# Patient Record
Sex: Female | Born: 1950 | Race: White | Hispanic: No | Smoking: Never smoker
Health system: Southern US, Community
[De-identification: ages and names within clinical notes are randomized; demographics above are authoritative.]

## PROBLEM LIST (undated history)

## (undated) DIAGNOSIS — E785 Hyperlipidemia, unspecified: Secondary | ICD-10-CM

## (undated) DIAGNOSIS — H269 Unspecified cataract: Secondary | ICD-10-CM

## (undated) DIAGNOSIS — A4902 Methicillin resistant Staphylococcus aureus infection, unspecified site: Secondary | ICD-10-CM

## (undated) DIAGNOSIS — K219 Gastro-esophageal reflux disease without esophagitis: Secondary | ICD-10-CM

## (undated) DIAGNOSIS — G709 Myoneural disorder, unspecified: Secondary | ICD-10-CM

## (undated) DIAGNOSIS — E039 Hypothyroidism, unspecified: Secondary | ICD-10-CM

## (undated) DIAGNOSIS — I10 Essential (primary) hypertension: Secondary | ICD-10-CM

## (undated) DIAGNOSIS — F419 Anxiety disorder, unspecified: Secondary | ICD-10-CM

## (undated) DIAGNOSIS — G250 Essential tremor: Secondary | ICD-10-CM

## (undated) DIAGNOSIS — H5000 Unspecified esotropia: Secondary | ICD-10-CM

## (undated) HISTORY — DX: Myoneural disorder, unspecified: G70.9

## (undated) HISTORY — PX: EYE SURGERY: SHX253

## (undated) HISTORY — DX: Unspecified cataract: H26.9

## (undated) HISTORY — DX: Hyperlipidemia, unspecified: E78.5

## (undated) HISTORY — DX: Essential (primary) hypertension: I10

## (undated) HISTORY — DX: Gastro-esophageal reflux disease without esophagitis: K21.9

## (undated) HISTORY — PX: LIPOMA EXCISION: SHX5283

## (undated) HISTORY — PX: TONSILLECTOMY: SUR1361

---

## 1999-02-11 ENCOUNTER — Other Ambulatory Visit: Admission: RE | Admit: 1999-02-11 | Discharge: 1999-02-11 | Payer: Self-pay | Admitting: Obstetrics and Gynecology

## 2000-01-28 ENCOUNTER — Other Ambulatory Visit: Admission: RE | Admit: 2000-01-28 | Discharge: 2000-01-28 | Payer: Self-pay | Admitting: Obstetrics and Gynecology

## 2001-02-11 ENCOUNTER — Other Ambulatory Visit: Admission: RE | Admit: 2001-02-11 | Discharge: 2001-02-11 | Payer: Self-pay | Admitting: Obstetrics and Gynecology

## 2002-03-31 ENCOUNTER — Other Ambulatory Visit: Admission: RE | Admit: 2002-03-31 | Discharge: 2002-03-31 | Payer: Self-pay | Admitting: Obstetrics and Gynecology

## 2003-04-03 ENCOUNTER — Other Ambulatory Visit: Admission: RE | Admit: 2003-04-03 | Discharge: 2003-04-03 | Payer: Self-pay | Admitting: Obstetrics and Gynecology

## 2004-04-15 ENCOUNTER — Other Ambulatory Visit: Admission: RE | Admit: 2004-04-15 | Discharge: 2004-04-15 | Payer: Self-pay | Admitting: Obstetrics and Gynecology

## 2004-04-24 ENCOUNTER — Encounter: Admission: RE | Admit: 2004-04-24 | Discharge: 2004-07-23 | Payer: Self-pay | Admitting: *Deleted

## 2005-04-16 ENCOUNTER — Other Ambulatory Visit: Admission: RE | Admit: 2005-04-16 | Discharge: 2005-04-16 | Payer: Self-pay | Admitting: Obstetrics and Gynecology

## 2008-11-23 DIAGNOSIS — A4902 Methicillin resistant Staphylococcus aureus infection, unspecified site: Secondary | ICD-10-CM

## 2008-11-23 HISTORY — DX: Methicillin resistant Staphylococcus aureus infection, unspecified site: A49.02

## 2009-09-05 ENCOUNTER — Encounter: Admission: RE | Admit: 2009-09-05 | Discharge: 2009-09-05 | Payer: Self-pay | Admitting: Orthopedic Surgery

## 2010-06-11 ENCOUNTER — Encounter: Admission: RE | Admit: 2010-06-11 | Discharge: 2010-06-11 | Payer: Self-pay | Admitting: Endocrinology

## 2010-12-23 ENCOUNTER — Encounter
Admission: RE | Admit: 2010-12-23 | Discharge: 2010-12-23 | Payer: Self-pay | Source: Home / Self Care | Attending: Endocrinology | Admitting: Endocrinology

## 2010-12-29 ENCOUNTER — Encounter: Payer: Self-pay | Admitting: Endocrinology

## 2011-04-02 ENCOUNTER — Other Ambulatory Visit (HOSPITAL_COMMUNITY)
Admission: RE | Admit: 2011-04-02 | Discharge: 2011-04-02 | Disposition: A | Discharge status: Home / Self Care | Payer: 59 | Source: Ambulatory Visit | Attending: Family Medicine | Admitting: Family Medicine

## 2011-04-02 ENCOUNTER — Other Ambulatory Visit: Payer: Self-pay | Admitting: Family Medicine

## 2011-04-02 DIAGNOSIS — Z124 Encounter for screening for malignant neoplasm of cervix: Secondary | ICD-10-CM | POA: Insufficient documentation

## 2011-04-06 ENCOUNTER — Other Ambulatory Visit: Payer: Self-pay | Admitting: Endocrinology

## 2011-04-06 DIAGNOSIS — E049 Nontoxic goiter, unspecified: Secondary | ICD-10-CM

## 2011-04-08 ENCOUNTER — Ambulatory Visit
Admission: RE | Admit: 2011-04-08 | Discharge: 2011-04-08 | Disposition: A | Discharge status: Home / Self Care | Payer: 59 | Source: Ambulatory Visit | Attending: Endocrinology | Admitting: Endocrinology

## 2011-04-08 DIAGNOSIS — E049 Nontoxic goiter, unspecified: Secondary | ICD-10-CM

## 2011-05-05 ENCOUNTER — Encounter: Payer: Self-pay | Admitting: Gastroenterology

## 2012-04-05 ENCOUNTER — Other Ambulatory Visit: Payer: Self-pay | Admitting: Endocrinology

## 2012-04-05 DIAGNOSIS — E049 Nontoxic goiter, unspecified: Secondary | ICD-10-CM

## 2012-04-13 DIAGNOSIS — G250 Essential tremor: Secondary | ICD-10-CM | POA: Insufficient documentation

## 2012-04-15 ENCOUNTER — Ambulatory Visit
Admission: RE | Admit: 2012-04-15 | Discharge: 2012-04-15 | Disposition: A | Discharge status: Home / Self Care | Payer: BC Managed Care – PPO | Source: Ambulatory Visit | Attending: Endocrinology | Admitting: Endocrinology

## 2012-04-15 DIAGNOSIS — E049 Nontoxic goiter, unspecified: Secondary | ICD-10-CM

## 2012-10-14 ENCOUNTER — Other Ambulatory Visit: Payer: Self-pay | Admitting: Obstetrics and Gynecology

## 2013-03-28 ENCOUNTER — Other Ambulatory Visit: Payer: Self-pay | Admitting: Endocrinology

## 2013-03-28 DIAGNOSIS — E049 Nontoxic goiter, unspecified: Secondary | ICD-10-CM

## 2013-05-02 DIAGNOSIS — H31109 Choroidal degeneration, unspecified, unspecified eye: Secondary | ICD-10-CM | POA: Insufficient documentation

## 2013-06-26 ENCOUNTER — Other Ambulatory Visit: Payer: BC Managed Care – PPO

## 2013-06-29 ENCOUNTER — Ambulatory Visit
Admission: RE | Admit: 2013-06-29 | Discharge: 2013-06-29 | Disposition: A | Discharge status: Home / Self Care | Payer: BC Managed Care – PPO | Source: Ambulatory Visit | Attending: Endocrinology | Admitting: Endocrinology

## 2013-06-29 DIAGNOSIS — E049 Nontoxic goiter, unspecified: Secondary | ICD-10-CM

## 2013-12-15 ENCOUNTER — Other Ambulatory Visit: Payer: Self-pay | Admitting: Obstetrics and Gynecology

## 2014-03-30 ENCOUNTER — Other Ambulatory Visit: Payer: Self-pay | Admitting: Endocrinology

## 2014-03-30 DIAGNOSIS — E049 Nontoxic goiter, unspecified: Secondary | ICD-10-CM

## 2014-07-14 DIAGNOSIS — H35051 Retinal neovascularization, unspecified, right eye: Secondary | ICD-10-CM | POA: Insufficient documentation

## 2014-12-26 DIAGNOSIS — H2513 Age-related nuclear cataract, bilateral: Secondary | ICD-10-CM | POA: Insufficient documentation

## 2015-01-24 ENCOUNTER — Other Ambulatory Visit: Payer: Self-pay | Admitting: Obstetrics and Gynecology

## 2015-01-25 LAB — CYTOLOGY - PAP

## 2015-03-20 ENCOUNTER — Ambulatory Visit
Admission: RE | Admit: 2015-03-20 | Discharge: 2015-03-20 | Disposition: A | Discharge status: Home / Self Care | Payer: BLUE CROSS/BLUE SHIELD | Source: Ambulatory Visit | Attending: Endocrinology | Admitting: Endocrinology

## 2015-03-20 DIAGNOSIS — E049 Nontoxic goiter, unspecified: Secondary | ICD-10-CM

## 2015-04-24 ENCOUNTER — Other Ambulatory Visit: Payer: Self-pay | Admitting: Dermatology

## 2015-04-24 DIAGNOSIS — C4491 Basal cell carcinoma of skin, unspecified: Secondary | ICD-10-CM

## 2015-04-24 HISTORY — DX: Basal cell carcinoma of skin, unspecified: C44.91

## 2016-02-17 ENCOUNTER — Other Ambulatory Visit: Payer: Self-pay | Admitting: Obstetrics and Gynecology

## 2016-02-17 LAB — HM PAP SMEAR

## 2016-02-19 LAB — CYTOLOGY - PAP

## 2016-02-24 ENCOUNTER — Other Ambulatory Visit: Payer: Self-pay | Admitting: Obstetrics and Gynecology

## 2016-02-24 DIAGNOSIS — N951 Menopausal and female climacteric states: Secondary | ICD-10-CM

## 2016-04-16 ENCOUNTER — Other Ambulatory Visit: Payer: BLUE CROSS/BLUE SHIELD

## 2016-04-27 ENCOUNTER — Other Ambulatory Visit: Payer: BLUE CROSS/BLUE SHIELD

## 2016-06-02 ENCOUNTER — Ambulatory Visit
Admission: RE | Admit: 2016-06-02 | Discharge: 2016-06-02 | Disposition: A | Discharge status: Home / Self Care | Payer: BLUE CROSS/BLUE SHIELD | Source: Ambulatory Visit | Attending: Obstetrics and Gynecology | Admitting: Obstetrics and Gynecology

## 2016-06-02 DIAGNOSIS — N951 Menopausal and female climacteric states: Secondary | ICD-10-CM

## 2017-02-03 ENCOUNTER — Other Ambulatory Visit: Payer: Self-pay | Admitting: Endocrinology

## 2017-02-03 DIAGNOSIS — E049 Nontoxic goiter, unspecified: Secondary | ICD-10-CM

## 2017-03-25 ENCOUNTER — Ambulatory Visit
Admission: RE | Admit: 2017-03-25 | Discharge: 2017-03-25 | Disposition: A | Discharge status: Home / Self Care | Payer: BLUE CROSS/BLUE SHIELD | Source: Ambulatory Visit | Attending: Endocrinology | Admitting: Endocrinology

## 2017-03-25 ENCOUNTER — Other Ambulatory Visit: Payer: BLUE CROSS/BLUE SHIELD

## 2017-03-25 DIAGNOSIS — E049 Nontoxic goiter, unspecified: Secondary | ICD-10-CM

## 2017-04-01 IMAGING — US US SOFT TISSUE HEAD/NECK
1 series · 13 of 25 positions shown · non-contrast
Comparison: 06/29/2013; 04/15/2012 ; 04/08/2011

CLINICAL DATA: Follow-up thyroid goiter.

EXAM:
THYROID ULTRASOUND
TECHNIQUE: Ultrasound examination of the thyroid gland and adjacent soft
tissues was performed.

[Series 1: us soft tissue head/neck · 0.09mm/px · 13 of 43 slices shown]
[im 1/43]
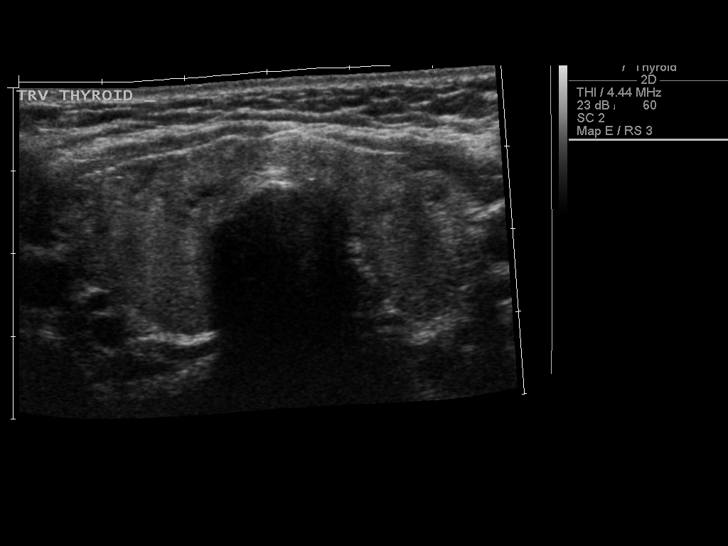
[im 4/43]
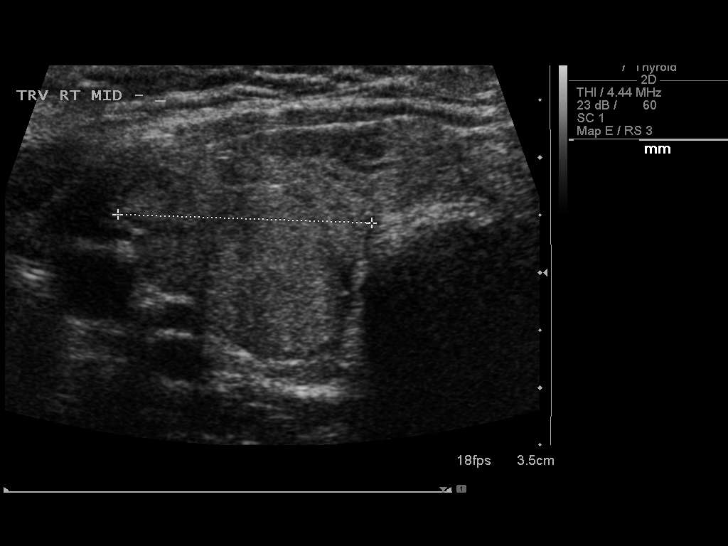
[im 8/43]
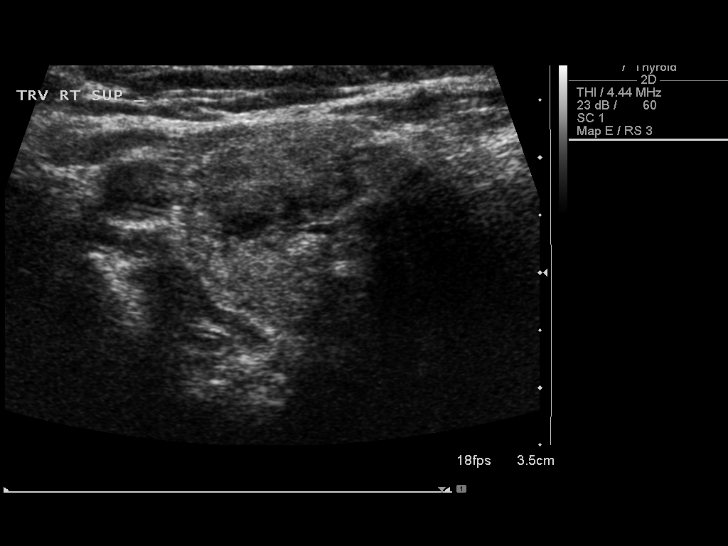
[im 11/43]
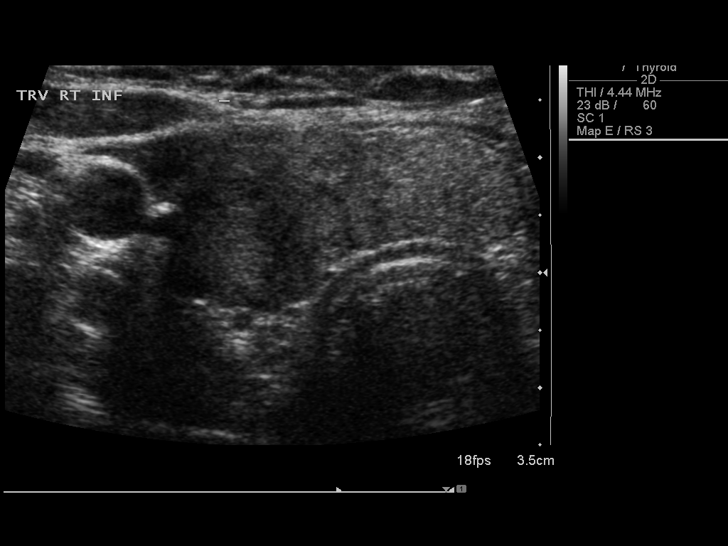
[im 15/43]
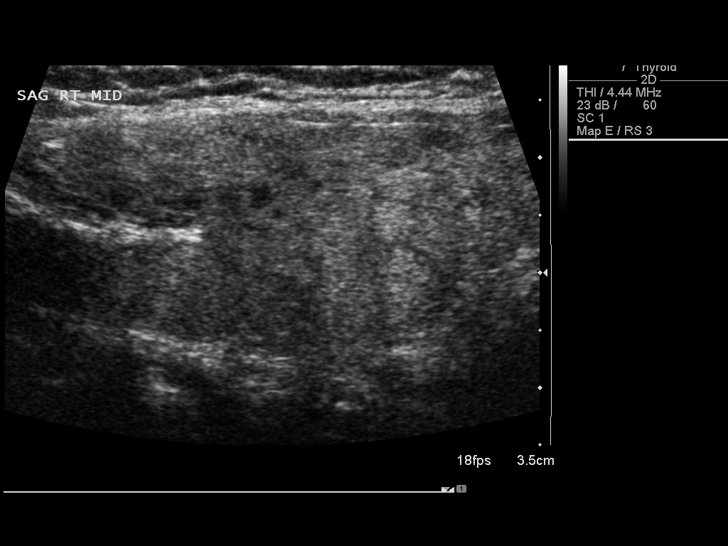
[im 18/43]
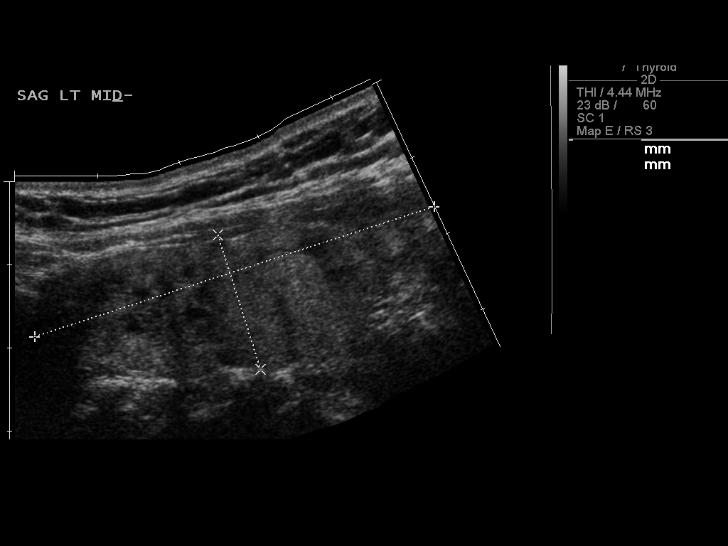
[im 22/43]
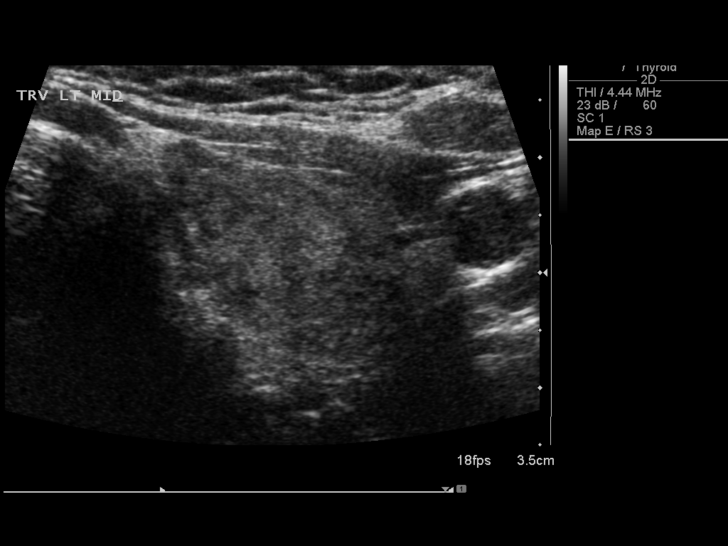
[im 25/43]
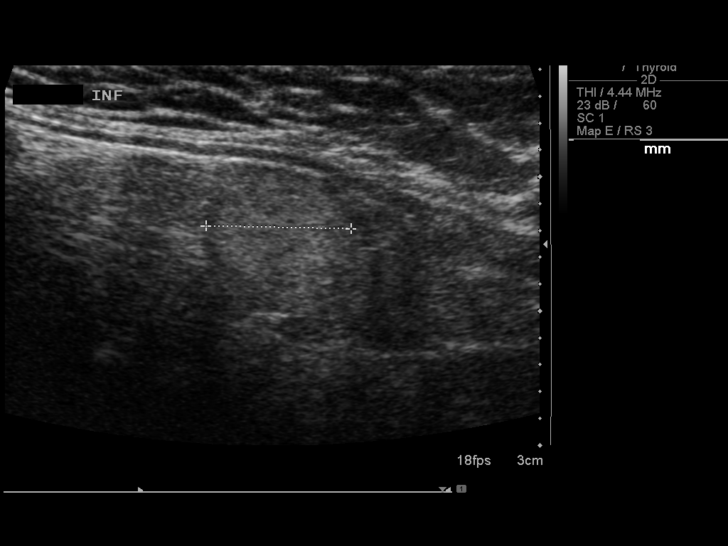
[im 29/43]
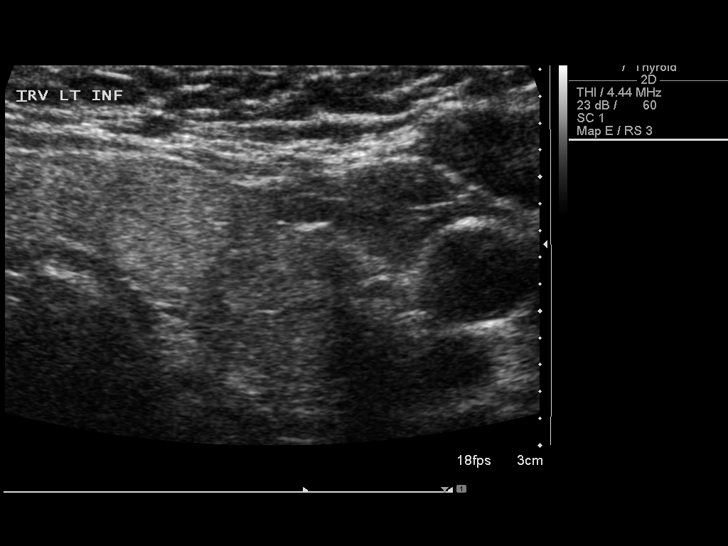
[im 32/43]
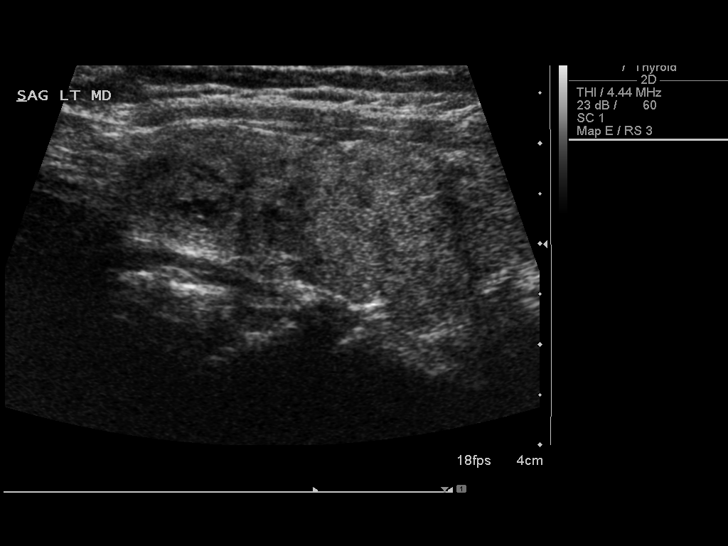
[im 36/43]
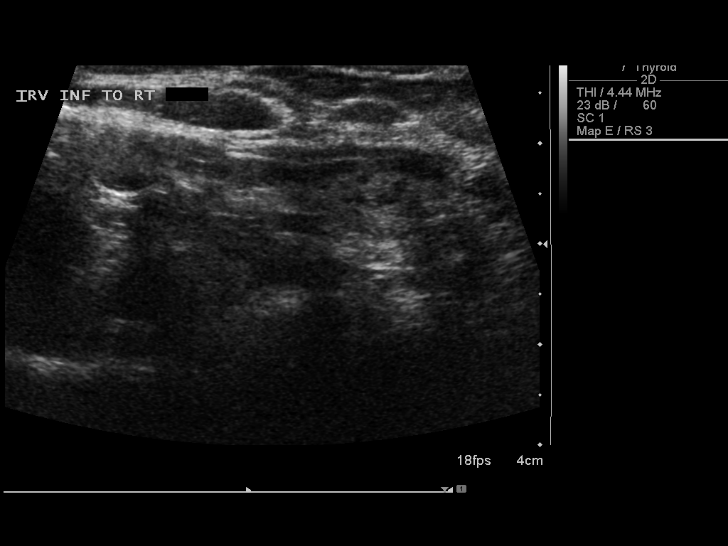
[im 39/43]
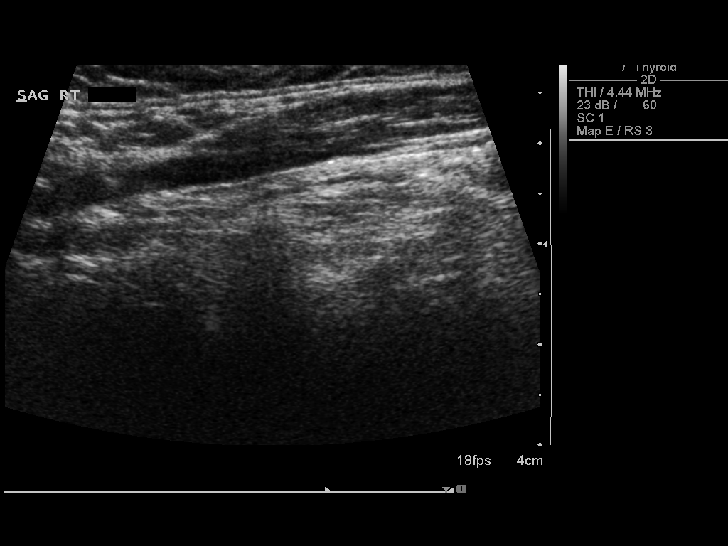
[im 43/43]
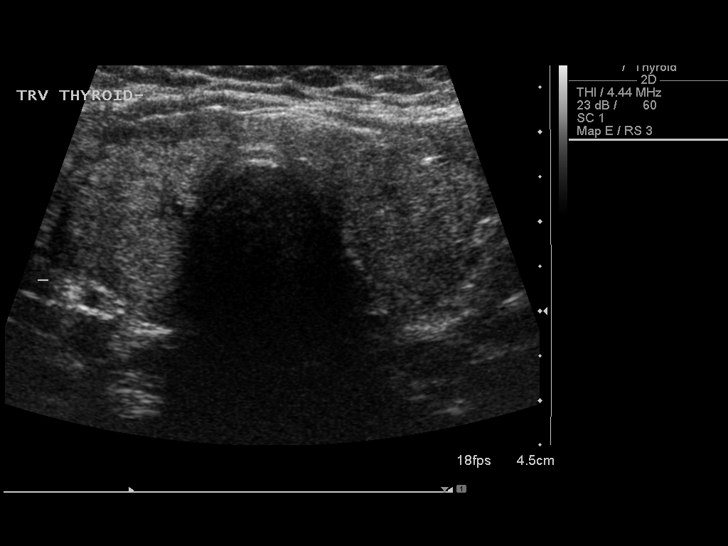

[13 of 25 positions shown; findings below may reference images not displayed]

FINDINGS: There is unchanged diffuse heterogeneity of thyroid parenchymal
echotexture. No new or enlarging thyroid nodules.

Right thyroid lobe

Measurements: Borderline enlarged measuring 5.2 x 2.0 x 2.2 cm,
unchanged, previously, 6.2 x 1.9 x 2.8 cm.

There is unchanged marked diffuse heterogeneity of the right lobe of
the thyroid without discrete nodule or mass.

Left thyroid lobe

Measurements: Borderline enlarged measuring 5.0 x 1.7 x 1.9 cm,
unchanged, previously, 5.2 x 1.7 x 1.9 cm.

Left, inferior, medial - 1.0 x 0.9 x 1.1 cm - mixed echogenic,
solid, ill-defined, potentially a pseudo nodule - grossly unchanged
since the [DATE] examination, previously, 1.2 x 1.1 x 1.1 cm.

Isthmus

Thickness: Borderline enlarged measuring in diameter, unchanged..

No discrete nodule or mass identified within the thyroid isthmus.

Lymphadenopathy

None visualized.
IMPRESSION: Similar findings of multi nodular goiter. No new or enlarging
thyroid nodules with the dominant left-sided thyroid nodule
appearing grossly unchanged since the [DATE] examination.

## 2017-10-19 ENCOUNTER — Ambulatory Visit: Payer: Self-pay | Admitting: Ophthalmology

## 2017-10-19 DIAGNOSIS — H5005 Alternating esotropia: Secondary | ICD-10-CM | POA: Insufficient documentation

## 2017-10-20 ENCOUNTER — Encounter (HOSPITAL_BASED_OUTPATIENT_CLINIC_OR_DEPARTMENT_OTHER): Payer: Self-pay | Admitting: *Deleted

## 2017-10-20 ENCOUNTER — Other Ambulatory Visit: Payer: Self-pay

## 2017-10-21 ENCOUNTER — Ambulatory Visit: Payer: Self-pay | Admitting: Ophthalmology

## 2017-10-21 NOTE — H&P (Signed)
Date of examination:  09-22-17  Indication for surgery: to straighten the eyes and allow some binocularity  Pertinent past medical history:  Past Medical History:  Diagnosis Date  . Anxiety   . Benign essential tremor    on inderal  . Esotropia   . Hypothyroidism     Pertinent ocular history:  Diplopia x 3-4 years.  Hx monovision.  Pseudophakic.    Pertinent family history: No family history on file.  General:  Healthy appearing patient in no distress.    Eyes:    Acuity Emlyn  OD 20/25  OS 20/20  External: Within normal limits     Anterior segment: PC IOL OU  Motility:   E(T)=10, comitant, fl RHT.  E(T)'=8.  Fuses with 10 BO, not 6 or 8  Fundus: deferred  Refraction: plano OU approx  Heart: Regular rate and rhythm without murmur     Lungs: Clear to auscultation      Impression:Intermittent esotropia, small angle, small fusional amplitudes.  Note Hx monovision-->disrupted fusion?  Plan: Because 10 PD is too small for a recession and too large for partial tenotomy of one medial rectus muscle, will do partial tenotomy of both MR's: Half to 2/3 of the tendon of one, then as much of the other as needed for the remainder, awake in OR.  Could do Scott's graded partial tendon recession of one MR to get about 10 PD, but with her very small fusional amplitudes I feel we need the precision of the awake partial tenotomy  Derry Skill

## 2017-10-21 NOTE — H&P (View-Only) (Signed)
Date of examination:  09-22-17  Indication for surgery: to straighten the eyes and allow some binocularity  Pertinent past medical history:  Past Medical History:  Diagnosis Date  . Anxiety   . Benign essential tremor    on inderal  . Esotropia   . Hypothyroidism     Pertinent ocular history:  Diplopia x 3-4 years.  Hx monovision.  Pseudophakic.    Pertinent family history: No family history on file.  General:  Healthy appearing patient in no distress.    Eyes:    Acuity Warren  OD 20/25  OS 20/20  External: Within normal limits     Anterior segment: PC IOL OU  Motility:   E(T)=10, comitant, fl RHT.  E(T)'=8.  Fuses with 10 BO, not 6 or 8  Fundus: deferred  Refraction: plano OU approx  Heart: Regular rate and rhythm without murmur     Lungs: Clear to auscultation      Impression:Intermittent esotropia, small angle, small fusional amplitudes.  Note Hx monovision-->disrupted fusion?  Plan: Because 10 PD is too small for a recession and too large for partial tenotomy of one medial rectus muscle, will do partial tenotomy of both MR's: Half to 2/3 of the tendon of one, then as much of the other as needed for the remainder, awake in OR.  Could do Scott's graded partial tendon recession of one MR to get about 10 PD, but with her very small fusional amplitudes I feel we need the precision of the awake partial tenotomy  Derry Skill

## 2017-10-22 ENCOUNTER — Other Ambulatory Visit: Payer: Self-pay

## 2017-10-22 ENCOUNTER — Encounter (HOSPITAL_BASED_OUTPATIENT_CLINIC_OR_DEPARTMENT_OTHER): Payer: Self-pay | Admitting: *Deleted

## 2017-10-22 ENCOUNTER — Ambulatory Visit (HOSPITAL_BASED_OUTPATIENT_CLINIC_OR_DEPARTMENT_OTHER): Payer: BLUE CROSS/BLUE SHIELD | Admitting: Anesthesiology

## 2017-10-22 ENCOUNTER — Ambulatory Visit (HOSPITAL_BASED_OUTPATIENT_CLINIC_OR_DEPARTMENT_OTHER)
Admission: RE | Admit: 2017-10-22 | Discharge: 2017-10-22 | Disposition: A | Discharge status: Home / Self Care | Payer: BLUE CROSS/BLUE SHIELD | Source: Ambulatory Visit | Attending: Ophthalmology | Admitting: Ophthalmology

## 2017-10-22 ENCOUNTER — Encounter (HOSPITAL_BASED_OUTPATIENT_CLINIC_OR_DEPARTMENT_OTHER)
Admission: RE | Disposition: A | Discharge status: Home / Self Care | Payer: Self-pay | Source: Ambulatory Visit | Attending: Ophthalmology

## 2017-10-22 DIAGNOSIS — F419 Anxiety disorder, unspecified: Secondary | ICD-10-CM | POA: Insufficient documentation

## 2017-10-22 DIAGNOSIS — E039 Hypothyroidism, unspecified: Secondary | ICD-10-CM | POA: Insufficient documentation

## 2017-10-22 DIAGNOSIS — H5000 Unspecified esotropia: Secondary | ICD-10-CM | POA: Diagnosis not present

## 2017-10-22 DIAGNOSIS — G250 Essential tremor: Secondary | ICD-10-CM | POA: Diagnosis not present

## 2017-10-22 HISTORY — PX: STRABISMUS SURGERY: SHX218

## 2017-10-22 HISTORY — DX: Anxiety disorder, unspecified: F41.9

## 2017-10-22 HISTORY — DX: Unspecified esotropia: H50.00

## 2017-10-22 HISTORY — DX: Hypothyroidism, unspecified: E03.9

## 2017-10-22 HISTORY — DX: Essential tremor: G25.0

## 2017-10-22 HISTORY — DX: Methicillin resistant Staphylococcus aureus infection, unspecified site: A49.02

## 2017-10-22 SURGERY — STRABISMUS SURGERY, BILATERAL
Anesthesia: Monitor Anesthesia Care | Site: Eye | Laterality: Bilateral

## 2017-10-22 MED ORDER — DEXAMETHASONE SODIUM PHOSPHATE 10 MG/ML IJ SOLN
INTRAMUSCULAR | Status: DC | PRN
  Administered 2017-10-22: 5 mg via INTRAVENOUS

## 2017-10-22 MED ORDER — SCOPOLAMINE 1 MG/3DAYS TD PT72: 1.0000 | MEDICATED_PATCH | Freq: Once | TRANSDERMAL | Status: DC | PRN

## 2017-10-22 MED ORDER — FENTANYL CITRATE (PF) 100 MCG/2ML IJ SOLN
INTRAMUSCULAR | Status: AC
  Filled 2017-10-22: qty 2

## 2017-10-22 MED ORDER — MEPERIDINE HCL 25 MG/ML IJ SOLN: 6.2500 mg | INTRAMUSCULAR | Status: DC | PRN

## 2017-10-22 MED ORDER — KETOROLAC TROMETHAMINE 30 MG/ML IJ SOLN
INTRAMUSCULAR | Status: DC | PRN
  Administered 2017-10-22: 30 mg via INTRAVENOUS

## 2017-10-22 MED ORDER — GLYCOPYRROLATE 0.2 MG/ML IJ SOLN
INTRAMUSCULAR | Status: DC | PRN
  Administered 2017-10-22: 0.2 mg via INTRAVENOUS

## 2017-10-22 MED ORDER — BSS IO SOLN
INTRAOCULAR | Status: DC | PRN
  Administered 2017-10-22: 15 mL via INTRAOCULAR

## 2017-10-22 MED ORDER — DEXAMETHASONE SODIUM PHOSPHATE 10 MG/ML IJ SOLN
INTRAMUSCULAR | Status: AC
  Filled 2017-10-22: qty 1

## 2017-10-22 MED ORDER — LACTATED RINGERS IV SOLN
INTRAVENOUS | Status: DC
  Administered 2017-10-22: 08:00:00 via INTRAVENOUS

## 2017-10-22 MED ORDER — KETOROLAC TROMETHAMINE 30 MG/ML IJ SOLN
INTRAMUSCULAR | Status: AC
  Filled 2017-10-22: qty 1

## 2017-10-22 MED ORDER — MIDAZOLAM HCL 2 MG/2ML IJ SOLN
1.0000 mg | INTRAMUSCULAR | Status: DC | PRN
  Administered 2017-10-22: 2 mg via INTRAVENOUS

## 2017-10-22 MED ORDER — PROMETHAZINE HCL 25 MG/ML IJ SOLN: 6.2500 mg | INTRAMUSCULAR | Status: DC | PRN

## 2017-10-22 MED ORDER — MIDAZOLAM HCL 2 MG/2ML IJ SOLN
INTRAMUSCULAR | Status: AC
  Filled 2017-10-22: qty 2

## 2017-10-22 MED ORDER — TETRACAINE HCL 0.5 % OP SOLN
OPHTHALMIC | Status: AC
  Filled 2017-10-22: qty 8

## 2017-10-22 MED ORDER — ONDANSETRON HCL 4 MG/2ML IJ SOLN
INTRAMUSCULAR | Status: DC | PRN
Start: 2017-10-22 — End: 2017-10-22
  Administered 2017-10-22: 4 mg via INTRAVENOUS

## 2017-10-22 MED ORDER — TOBRAMYCIN-DEXAMETHASONE 0.3-0.1 % OP OINT
TOPICAL_OINTMENT | OPHTHALMIC | Status: DC | PRN
  Administered 2017-10-22: 1 via OPHTHALMIC

## 2017-10-22 MED ORDER — FENTANYL CITRATE (PF) 100 MCG/2ML IJ SOLN
50.0000 ug | INTRAMUSCULAR | Status: AC | PRN
  Administered 2017-10-22: 50 ug via INTRAVENOUS
  Administered 2017-10-22 (×2): 25 ug via INTRAVENOUS

## 2017-10-22 MED ORDER — HYDROMORPHONE HCL 1 MG/ML IJ SOLN: 0.2500 mg | INTRAMUSCULAR | Status: DC | PRN

## 2017-10-22 MED ORDER — ONDANSETRON HCL 4 MG/2ML IJ SOLN
INTRAMUSCULAR | Status: AC
  Filled 2017-10-22: qty 2

## 2017-10-22 MED ORDER — LACTATED RINGERS IV SOLN: INTRAVENOUS | Status: DC

## 2017-10-22 MED ORDER — TETRACAINE 0.5 % OP SOLN OPTIME - NO CHARGE
OPHTHALMIC | Status: DC | PRN
  Administered 2017-10-22: 2 [drp] via OPHTHALMIC

## 2017-10-22 SURGICAL SUPPLY — 30 items
APL SRG 3 HI ABS STRL LF PLS (MISCELLANEOUS) ×1
APPLICATOR COTTON TIP 6IN STRL (MISCELLANEOUS) ×8 IMPLANT
APPLICATOR DR MATTHEWS STRL (MISCELLANEOUS) ×2 IMPLANT
BANDAGE EYE OVAL (MISCELLANEOUS) IMPLANT
CAUTERY EYE LOW TEMP 1300F FIN (OPHTHALMIC RELATED) ×1 IMPLANT
COVER BACK TABLE 60X90IN (DRAPES) ×2 IMPLANT
COVER MAYO STAND STRL (DRAPES) ×2 IMPLANT
DRAPE SURG 17X23 STRL (DRAPES) ×4 IMPLANT
DRAPE U-SHAPE 76X120 STRL (DRAPES) ×1 IMPLANT
GLOVE BIO SURGEON STRL SZ 6.5 (GLOVE) ×5 IMPLANT
GLOVE BIOGEL M STRL SZ7.5 (GLOVE) ×2 IMPLANT
GOWN STRL REUS W/ TWL LRG LVL3 (GOWN DISPOSABLE) ×1 IMPLANT
GOWN STRL REUS W/TWL LRG LVL3 (GOWN DISPOSABLE) ×2
GOWN STRL REUS W/TWL XL LVL3 (GOWN DISPOSABLE) ×4 IMPLANT
NS IRRIG 1000ML POUR BTL (IV SOLUTION) ×2 IMPLANT
PACK BASIN DAY SURGERY FS (CUSTOM PROCEDURE TRAY) ×2 IMPLANT
SHEET MEDIUM DRAPE 40X70 STRL (DRAPES) IMPLANT
SPEAR EYE SURG WECK-CEL (MISCELLANEOUS) ×8 IMPLANT
STRIP CLOSURE SKIN 1/4X4 (GAUZE/BANDAGES/DRESSINGS) IMPLANT
SUT 6 0 SILK T G140 8DA (SUTURE) IMPLANT
SUT MERSILENE 6-0 18IN S14 8MM (SUTURE)
SUT PLAIN 6 0 TG1408 (SUTURE) ×1 IMPLANT
SUT SILK 4 0 C 3 735G (SUTURE) IMPLANT
SUT VICRYL 6 0 S 28 (SUTURE) IMPLANT
SUT VICRYL ABS 6-0 S29 18IN (SUTURE) ×1 IMPLANT
SUTURE MERSLN 6-0 18IN S14 8MM (SUTURE) IMPLANT
SYR 10ML LL (SYRINGE) ×2 IMPLANT
SYR TB 1ML LL NO SAFETY (SYRINGE) ×2 IMPLANT
TOWEL OR 17X24 6PK STRL BLUE (TOWEL DISPOSABLE) ×2 IMPLANT
TRAY DSU PREP LF (CUSTOM PROCEDURE TRAY) ×2 IMPLANT

## 2017-10-22 NOTE — Anesthesia Preprocedure Evaluation (Addendum)
Anesthesia Evaluation  Patient identified by MRN, date of birth, ID band Patient awake    Reviewed: Allergy & Precautions, NPO status , Patient's Chart, lab work & pertinent test results  Airway Mallampati: I       Dental  (+) Teeth Intact, Dental Advisory Given   Pulmonary neg pulmonary ROS,    breath sounds clear to auscultation       Cardiovascular negative cardio ROS   Rhythm:Regular Rate:Normal     Neuro/Psych Anxiety negative neurological ROS     GI/Hepatic negative GI ROS, Neg liver ROS,   Endo/Other  Hypothyroidism   Renal/GU negative Renal ROS     Musculoskeletal negative musculoskeletal ROS (+)   Abdominal Normal abdominal exam  (+)   Peds  Hematology negative hematology ROS (+)   Anesthesia Other Findings Day of surgery medications reviewed with the patient.  Reproductive/Obstetrics                            Anesthesia Physical Anesthesia Plan  ASA: II  Anesthesia Plan: MAC   Post-op Pain Management:    Induction: Intravenous  PONV Risk Score and Plan: 3 and Ondansetron, Propofol infusion and Midazolam  Airway Management Planned: Natural Airway and Nasal Cannula  Additional Equipment:   Intra-op Plan:   Post-operative Plan:   Informed Consent: I have reviewed the patients History and Physical, chart, labs and discussed the procedure including the risks, benefits and alternatives for the proposed anesthesia with the patient or authorized representative who has indicated his/her understanding and acceptance.     Plan Discussed with:   Anesthesia Plan Comments:         Anesthesia Quick Evaluation

## 2017-10-22 NOTE — Transfer of Care (Signed)
Immediate Anesthesia Transfer of Care Note  Patient: Gwendolyn Sampson  Procedure(s) Performed: REPAIR STRABISMUS BILATERAL (Bilateral Eye)  Patient Location: PACU  Anesthesia Type:MAC  Level of Consciousness: awake, alert  and oriented  Airway & Oxygen Therapy: Patient Spontanous Breathing  Post-op Assessment: Report given to RN and Post -op Vital signs reviewed and stable  Post vital signs: Reviewed and stable  Last Vitals:  Vitals:   10/22/17 0730  BP: (!) 141/92  Pulse: 60  Resp: 18  Temp: 36.9 C  SpO2: 100%    Last Pain:  Vitals:   10/22/17 0730  TempSrc: Oral      Patients Stated Pain Goal: 0 (16/10/96 0454)  Complications: No apparent anesthesia complications

## 2017-10-22 NOTE — Discharge Instructions (Signed)
°  Post Anesthesia Home Care Instructions ° °Activity: °Get plenty of rest for the remainder of the day. A responsible individual must stay with you for 24 hours following the procedure.  °For the next 24 hours, DO NOT: °-Drive a car °-Operate machinery °-Drink alcoholic beverages °-Take any medication unless instructed by your physician °-Make any legal decisions or sign important papers. ° °Meals: °Start with liquid foods such as gelatin or soup. Progress to regular foods as tolerated. Avoid greasy, spicy, heavy foods. If nausea and/or vomiting occur, drink only clear liquids until the nausea and/or vomiting subsides. Call your physician if vomiting continues. ° °Special Instructions/Symptoms: °Your throat may feel dry or sore from the anesthesia or the breathing tube placed in your throat during surgery. If this causes discomfort, gargle with warm salt water. The discomfort should disappear within 24 hours. ° °If you had a scopolamine patch placed behind your ear for the management of post- operative nausea and/or vomiting: ° °1. The medication in the patch is effective for 72 hours, after which it should be removed.  Wrap patch in a tissue and discard in the trash. Wash hands thoroughly with soap and water. °2. You may remove the patch earlier than 72 hours if you experience unpleasant side effects which may include dry mouth, dizziness or visual disturbances. °3. Avoid touching the patch. Wash your hands with soap and water after contact with the patch. ° ° °Dr. Mumtaz Lovins's Postop Instructions:   ° °Diet: Clear liquids, advance to soft foods then regular diet as tolerated by this evening. ° °Pain control:  ° 1)  Ibuprofen 600 mg by mouth every 6-8 hours as needed for pain ° 2)  Ice pack/cold compress to operated eye(s) as desired ° °Eye medications:   Tobradex or Zylet eye ointment 1/2 inch in operated eye(s) twice a day if directed to do so by Dr. Tauheedah Bok ° °Activity: No swimming for 1 week.  It is OK to let water  run over the face and eyes while showering or taking a bath, even during the first week.  No other restriction on exercise or activity. ° °Call Dr. Iden Stripling's office [phone] with any problems or concerns. °  ° °

## 2017-10-22 NOTE — Op Note (Signed)
10/22/2017  11:40 AM  PATIENT:  Gwendolyn Sampson    PRE-OPERATIVE DIAGNOSIS:  ESOTROPIA  POST-OPERATIVE DIAGNOSIS:  same  PROCEDURE:  1.  Partial tendon recession 3.5 mm left eye   2.  Graded partial tenotomy 85% right eye  SURGEON:  Derry Skill, MD  ANESTHESIA:   General  COMPLICATIONS: none  OPERATIVE PROCEDURE: After routine preoperative timeout patient including informed consent, the patient was taken to the operating room where she was identified by me. Monitors were placed by anesthesia. Light IV sedation was administered.  Light IV sedation was administered.  The patient was prepped and draped in sterile fashion.  Topical tetracaine was placed in the left eye.  Lid speculum was placed in the left eye. Through an inferonasal fornix incision through conjunctiva contents fashion, the left medial rectus muscle was engaged and a series of muscle hooks. The inferior end of this insertion was secured with a 6-0 Vicryl suture, tied securely approximately 1 mm from the insertion. The muscle was disinserted beginning at the inferior end of the insertion, leaving about 1.5 mm of the muscle leaving about 1.5 mm of the muscle insertion intact.  The disinserted corner of the muscle The disinserted corner of the muscle was reattached to sclera 3 mm posterior to the inferior and the muscle stump, effecting a 3.5 mm graded partial tenotomy of the left medial rectus muscle.  The lid speculum was transferred to the right eye, where the medial rectus muscle was isolated as described left eye. The right medial rectus muscle was disinserted beginning at the superior end of the insertion, continuing to about the midpoint of the insertion. At this point the patient was sat up. She was still diplopic when viewing a vertical line about 10 feet from her.  She was again placed supine She was again placed supine and another another 1-2 mm of the muscle was disinserted.  She still had intermittent diplopia when sat  upright, so the disinsertion was continued until about 1.5 mm of the insertion remained intact.  At this point she had stable single vision . Conjunctiva was closed with a single 6-0 plain gut suture. Tobrex ointment was placed in each eye. The patient was taken to recovery room in stable condition, having suffered no intraoperative or immediate postoperative complications.  Derry Skill, MD

## 2017-10-22 NOTE — Interval H&P Note (Signed)
History and Physical Interval Note:  10/22/2017 9:30 AM  Gwendolyn Sampson  has presented today for surgery, with the diagnosis of ESOTROPIA  The various methods of treatment have been discussed with the patient and family. After consideration of risks, benefits and other options for treatment, the patient has consented to  Procedure(s): REPAIR STRABISMUS BILATERAL (Bilateral) as a surgical intervention .  The patient's history has been reviewed, patient examined, no change in status, stable for surgery.  I have reviewed the patient's chart and labs.  Questions were answered to the patient's satisfaction.     Derry Skill

## 2017-10-22 NOTE — Anesthesia Postprocedure Evaluation (Signed)
Anesthesia Post Note  Patient: Gwendolyn Sampson  Procedure(s) Performed: REPAIR STRABISMUS BILATERAL (Bilateral Eye)     Patient location during evaluation: PACU Anesthesia Type: MAC Level of consciousness: awake and alert Pain management: pain level controlled Vital Signs Assessment: post-procedure vital signs reviewed and stable Respiratory status: spontaneous breathing, nonlabored ventilation, respiratory function stable and patient connected to nasal cannula oxygen Cardiovascular status: stable and blood pressure returned to baseline Postop Assessment: no apparent nausea or vomiting Anesthetic complications: no    Last Vitals:  Vitals:   10/22/17 1200 10/22/17 1228  BP: (!) 145/82 121/81  Pulse: 66 (!) 56  Resp: 12 14  Temp:  36.7 C  SpO2: 97% 96%    Last Pain:  Vitals:   10/22/17 1228  TempSrc: Oral  PainSc: 2                  Effie Berkshire

## 2017-10-25 ENCOUNTER — Encounter (HOSPITAL_BASED_OUTPATIENT_CLINIC_OR_DEPARTMENT_OTHER): Payer: Self-pay | Admitting: Ophthalmology

## 2018-06-14 ENCOUNTER — Other Ambulatory Visit: Payer: Self-pay | Admitting: Obstetrics and Gynecology

## 2018-06-14 DIAGNOSIS — E2839 Other primary ovarian failure: Secondary | ICD-10-CM

## 2018-07-11 LAB — COMPREHENSIVE METABOLIC PANEL
Albumin: 4.1 (ref 3.5–5.0)
Calcium: 9.6 (ref 8.7–10.7)
GFR calc Af Amer: 83
GFR calc non Af Amer: 72

## 2018-07-11 LAB — HEPATIC FUNCTION PANEL
ALT: 16 (ref 7–35)
AST: 17 (ref 13–35)
Alkaline Phosphatase: 70 (ref 25–125)
Bilirubin, Direct: 0.11
Bilirubin, Total: 0.3

## 2018-07-11 LAB — BASIC METABOLIC PANEL
BUN: 20 (ref 4–21)
Chloride: 107 (ref 99–108)
Creatinine: 0.9 (ref <=1.1)
Glucose: 90
Potassium: 4.2 (ref 3.4–5.3)
Sodium: 145 (ref 137–147)

## 2018-07-14 LAB — CBC AND DIFFERENTIAL
HCT: 43 (ref 36–46)
Hemoglobin: 14.2 (ref 12.0–16.0)
Platelets: 189 (ref 150–399)
WBC: 6.1

## 2018-07-14 LAB — CBC: RBC: 4.87 (ref 3.87–5.11)

## 2018-07-27 ENCOUNTER — Other Ambulatory Visit: Payer: BLUE CROSS/BLUE SHIELD

## 2018-09-19 LAB — HM COLONOSCOPY

## 2018-10-04 DIAGNOSIS — R5383 Other fatigue: Secondary | ICD-10-CM | POA: Insufficient documentation

## 2018-10-04 HISTORY — DX: Other fatigue: R53.83

## 2019-02-15 ENCOUNTER — Ambulatory Visit: Payer: Self-pay | Admitting: *Deleted

## 2019-02-15 NOTE — Telephone Encounter (Signed)
Contacted pt to find out the location of the urgent care that she will be utilizing; she says that she will go to [facility], [phone]; will notify location.

## 2019-02-15 NOTE — Telephone Encounter (Signed)
Spoke Gwendolyn Sampson at [facility], and notified her of patient's pending arrival.

## 2019-02-15 NOTE — Telephone Encounter (Signed)
Pt called stating that she has cough with intermitent episodes of phelgm production, temp 99.4, and chills since pm 02/13/2019; she has been taking tylenol; the pt is calling to make a new pt appointment with a provider at [facility] to establish care, and to get a note to return to work due to her symptoms; the pt answers "no" to questions on [place] [facility], and she works in an orthopedic office; the pt also says that she has chest pressure, and shortness of breath with exertion; for her acute issue recommendations made per nurse triage protocol but the pt does not want to go to ED; she is agreeable to going to urgent care; pt also given number to [facility] [phone]; [facility] agent Constance Holster will contact the pt to schedule new patient appointment at [facility]; the pt can be contacted at [phone]  Reason for Disposition . [1] Caller concerned that exposure to COVID-19 occurred BUT [2] does not meet COVID-19 EXPOSURE criteria from CDC . [1] Difficulty breathing occurs AND [2] within 14 days of COVID-19 EXPOSURE (Close Contact)  Answer Assessment - Initial Assessment Questions 1. CONFIRMED CASE: "Who is the person with the confirmed COVID-19 infection that you were exposed to?"     Pt works in [employer] office 2. PLACE of CONTACT: "Where were you when you were exposed to COVID-19  (coronavirus disease 2019)?" (e.g., city, state, country)    [place], [place], [place] 3. TYPE of CONTACT: "How much contact was there?" (e.g., live in same house, work in same office, same school)     Same office 4. DATE of CONTACT: "When did you have contact with a coronavirus patient?" (e.g., days)     Not sure 5. DURATION of CONTACT: "How long were you in contact with the COVID-19 (coronavirus disease) patient?" (e.g., a few seconds, passed by person, a few minutes, live with the patient)    hours 6. SYMPTOMS: "Do you have any symptoms?" (e.g., fever, cough, breathing difficulty)  Shortness of breath with exertion; chest pressure, chills 7. PREGNANCY OR POSTPARTUM: "Is there any chance you are pregnant?" "When was your last menstrual period?" "Did you deliver in the last 2 weeks?"    No, no 8. HIGH RISK: "Do you have any heart or lung problems? Do you have a weakened immune system?" (e.g., CHF, COPD, asthma, HIV positive, chemotherapy, renal failure, diabetes mellitus, sickle cell anemia)    Hypothyroid, age 52  Protocols used: CORONAVIRUS (COVID-19) EXPOSURE-A-AH

## 2019-03-01 ENCOUNTER — Ambulatory Visit (INDEPENDENT_AMBULATORY_CARE_PROVIDER_SITE_OTHER): Payer: PRIVATE HEALTH INSURANCE

## 2019-03-01 ENCOUNTER — Other Ambulatory Visit: Payer: Self-pay

## 2019-03-01 ENCOUNTER — Ambulatory Visit (HOSPITAL_COMMUNITY)
Admission: EM | Admit: 2019-03-01 | Discharge: 2019-03-01 | Disposition: A | Discharge status: Home / Self Care | Payer: PRIVATE HEALTH INSURANCE | Attending: Family Medicine | Admitting: Family Medicine

## 2019-03-01 ENCOUNTER — Encounter (HOSPITAL_COMMUNITY): Payer: Self-pay | Admitting: Emergency Medicine

## 2019-03-01 DIAGNOSIS — R05 Cough: Secondary | ICD-10-CM | POA: Diagnosis not present

## 2019-03-01 DIAGNOSIS — R5383 Other fatigue: Secondary | ICD-10-CM

## 2019-03-01 DIAGNOSIS — R059 Cough, unspecified: Secondary | ICD-10-CM

## 2019-03-01 MED ORDER — HYDROXYZINE HCL 10 MG PO TABS: 10.0000 mg | ORAL_TABLET | Freq: Every evening | ORAL | 0 refills | Status: DC | PRN

## 2019-03-01 NOTE — ED Provider Notes (Signed)
[facility]   [id number] 03/01/19 Arrival Time: 4580  Cc: COUGH  SUBJECTIVE:  Gwendolyn Sampson is a 68 y.o. female hx significant for hypothyroid, and benign essential tremor, who presents with "COVID-like symptoms" that began on 02/13/2019.  Reports coworker currently hospitalized for what she believes is COVID.  Reports cough, fever, and diarrhea.  Was having daily fever with tmax of 103 that resolved 1 week ago.  Still having cough, chest congestion, chest pressure, and fatigue.  Has NOT tried OTC medication.  Was taking acetaminophen for fever.  Denies previous symptoms. Denies current fever, SOB, dyspnea, chest pain, rhinorrhea, sore throat, wheezing, nausea, changes in bowel or bladder habits.    Also mentions intermittent itchy rash to anterior chest.  Started in the winter, and has gotten worse with recent illness.  Has tried OTC medications.  Reports similar symptoms in the past.    ROS: As per HPI.  Past Medical History:  Diagnosis Date  . Anxiety   . Benign essential tremor    on inderal  . Esotropia   . Hypothyroidism   . MRSA (methicillin resistant Staphylococcus aureus) 2010   wrist, waist, buttock   Past Surgical History:  Procedure Laterality Date  . EYE SURGERY     cataract  . LIPOMA EXCISION     on bacl  . STRABISMUS SURGERY Bilateral 10/22/2017   Procedure: REPAIR STRABISMUS BILATERAL;  Surgeon: Everitt Amber, MD;  Location: [facility];  Service: Ophthalmology;  Laterality: Bilateral;  . TONSILLECTOMY     No Known Allergies No current facility-administered medications on file prior to encounter.    Current Outpatient Medications on File Prior to Encounter  Medication Sig Dispense Refill  . levothyroxine (SYNTHROID, LEVOTHROID) 75 MCG tablet Take 75 mcg by mouth daily before breakfast.    . propranolol ER (INDERAL LA) 60 MG 24 hr capsule Take 60 mg by mouth daily.    Marland Kitchen aspirin EC 81 MG tablet Take 81 mg by mouth daily.    .  Docosahexaenoic Acid (DHA NATURAL OMEGA-3 PO) Take by mouth.    . multivitamin-lutein (OCUVITE-LUTEIN) CAPS capsule Take 1 capsule by mouth daily.    . Nutritional Supplements (JUICE PLUS FIBRE PO) Take by mouth.      Social History   Socioeconomic History  . Marital status: Married    Spouse name: Not on file  . Number of children: Not on file  . Years of education: Not on file  . Highest education level: Not on file  Occupational History  . Not on file  Social Needs  . Financial resource strain: Not on file  . Food insecurity:    Worry: Not on file    Inability: Not on file  . Transportation needs:    Medical: Not on file    Non-medical: Not on file  Tobacco Use  . Smoking status: Never Smoker  . Smokeless tobacco: Never Used  Substance and Sexual Activity  . Alcohol use: No    Frequency: Never  . Drug use: No  . Sexual activity: Not on file  Lifestyle  . Physical activity:    Days per week: Not on file    Minutes per session: Not on file  . Stress: Not on file  Relationships  . Social connections:    Talks on phone: Not on file    Gets together: Not on file    Attends religious service: Not on file    Active member of club or organization: Not on  file    Attends meetings of clubs or organizations: Not on file    Relationship status: Not on file  . Intimate partner violence:    Fear of current or ex partner: Not on file    Emotionally abused: Not on file    Physically abused: Not on file    Forced sexual activity: Not on file  Other Topics Concern  . Not on file  Social History Narrative  . Not on file   History reviewed. No pertinent family history.   OBJECTIVE:  Vitals:   03/01/19 1328  BP: 115/70  Pulse: 74  Resp: 12  Temp: 98.3 F (36.8 C)  TempSrc: Oral  SpO2: 97%     General appearance: Alert, appears mildly fatigued, but nontoxic; speaking in full sentences without difficulty HEENT:NCAT; Ears: EACs clear, TMs pearly gray; Eyes: PERRL.   EOM grossly intact. Nose: nares patent without rhinorrhea; Throat: tonsils nonerythematous or enlarged, uvula midline  Neck: supple without LAD Lungs: clear to auscultation bilaterally without adventitious breath sounds; normal respiratory effort; mild cough present Heart: regular rate and rhythm.  Radial pulses 2+ symmetrical bilaterally Skin: warm and dry; erythematous papular pustular rash with scabbing to anterior superior chest; no obvious drainage or active bleeding; mild blanching with pressure Psychological: alert and cooperative; normal mood and affect  DIAGNOSTIC STUDIES:  Dg Chest 2 View  Result Date: 03/01/2019 CLINICAL DATA:  Cough EXAM: CHEST - 2 VIEW COMPARISON:  None. FINDINGS: Heart is normal size. Interstitial opacities are noted in the lungs bilaterally, right greater than left, favor chronic interstitial lung disease/fibrosis. Is difficult to completely exclude acute process. No effusions. No acute bony abnormality. IMPRESSION: Interstitial prominence throughout the lungs, right greater than left, favor chronic interstitial lung disease/fibrosis. Without old studies, it is difficult to completely exclude superimposed acute process. Electronically Signed   By: Rolm Baptise M.D.   On: 03/01/2019 14:22     ASSESSMENT & PLAN:  1. Cough   2. Fatigue, unspecified type     Meds ordered this encounter  Medications  . hydrOXYzine (ATARAX/VISTARIL) 10 MG tablet    Sig: Take 1 tablet (10 mg total) by mouth at bedtime as needed for itching.    Dispense:  12 tablet    Refill:  0    Order Specific Question:   Supervising Provider    Answer:   Raylene Everts [[id number]]    Orders Placed This Encounter  Procedures  . DG Chest 2 View    Standing Status:   Standing    Number of Occurrences:   1    Order Specific Question:   Reason for Exam (SYMPTOM  OR DIAGNOSIS REQUIRED)    Answer:   cough    X-rays showed possible scarring/ fibrosis of lungs Recommending CT scan at this  time for further evaluation Declines further evaluation and management in the ED at this time.  Would like to have CT scan ordered on an outpatient basis.    You were not tested for COVID today You should remain isolated in your home for 7 days from symptom onset AND greater than 72 hours after symptoms resolution (absence of fever without the use of fever-reducing medication and improvement in respiratory symptoms), whichever is longer Get plenty of rest and push fluids Take OTC tylenol as needed for fever, body aches, and/or chills  Hydroxyzine prescribed for itchy associated with rash Avoid scratching Please follow up with PCP or Molli Barrows FNP for further evaluation and management if rash  persist  Call or go to the ED if you have any new or worsening symptoms such as fever, worsening cough, shortness of breath, chest tightness, chest pain, turning blue, changes in mental status, etc...  Reviewed expectations re: course of current medical issues. Questions answered. Outlined signs and symptoms indicating need for more acute intervention. Patient verbalized understanding. After Visit Summary given.          Lestine Box, PA-C 03/01/19 1543

## 2019-03-01 NOTE — Discharge Instructions (Addendum)
X-rays showed possible scarring/ fibrosis of lungs Recommending CT scan at this time for further evaluation Declines further evaluation and management in the ED at this time.  Would like to have CT scan ordered on an outpatient basis.    You were not tested for COVID today You should remain isolated in your home for 7 days from symptom onset AND greater than 72 hours after symptoms resolution (absence of fever without the use of fever-reducing medication and improvement in respiratory symptoms), whichever is longer Get plenty of rest and push fluids Take OTC tylenol as needed for fever, body aches, and/or chills  Hydroxyzine prescribed for itchy associated with rash.  Take prior to bed.  This medication may make you drowsy, do not take while driving or operating heavy machinery.   Avoid scratching Please follow up with PCP or Molli Barrows FNP for further evaluation and management if rash persist  Call or go to the ED if you have any new or worsening symptoms such as fever, worsening cough, shortness of breath, chest tightness, chest pain, turning blue, changes in mental status, etc..Marland Kitchen

## 2019-03-01 NOTE — ED Triage Notes (Signed)
Pt states she has been sick since March 23.  She had a cough and a fever of 103. She has been fever free for the last week.  Pt complains of weakness for the last week and still coughing sometimes.  She denies SOB.  She also reports a rash across her chest and back.

## 2019-03-02 ENCOUNTER — Ambulatory Visit (INDEPENDENT_AMBULATORY_CARE_PROVIDER_SITE_OTHER): Payer: PRIVATE HEALTH INSURANCE | Admitting: Family Medicine

## 2019-03-02 ENCOUNTER — Encounter: Payer: Self-pay | Admitting: Family Medicine

## 2019-03-02 DIAGNOSIS — E039 Hypothyroidism, unspecified: Secondary | ICD-10-CM | POA: Diagnosis not present

## 2019-03-02 DIAGNOSIS — R5383 Other fatigue: Secondary | ICD-10-CM | POA: Diagnosis not present

## 2019-03-02 DIAGNOSIS — J849 Interstitial pulmonary disease, unspecified: Secondary | ICD-10-CM | POA: Diagnosis not present

## 2019-03-02 DIAGNOSIS — G250 Essential tremor: Secondary | ICD-10-CM

## 2019-03-02 DIAGNOSIS — R05 Cough: Secondary | ICD-10-CM | POA: Diagnosis not present

## 2019-03-02 DIAGNOSIS — R059 Cough, unspecified: Secondary | ICD-10-CM

## 2019-03-02 HISTORY — DX: Interstitial pulmonary disease, unspecified: J84.9

## 2019-03-02 NOTE — Assessment & Plan Note (Signed)
-  Admits to some ongoing fatigue, unlikely related to hypothyroidism.  She is followed regularly by Dr. Chalmers Cater, -Additional labs ordered.

## 2019-03-02 NOTE — Assessment & Plan Note (Signed)
-  She needs further evaluation of her abnormal chest xray.  -CT scan ordered  -? Resolving inflammatory changes related to recent illness.

## 2019-03-02 NOTE — Progress Notes (Signed)
Gwendolyn Sampson - 68 y.o. female MRN [id number]  Date of birth: [birth year]   This visit type was conducted due to national recommendations for restrictions regarding the COVID-19 Pandemic (e.g. social distancing).  This format is felt to be most appropriate for this patient at this time.  All issues noted in this document were discussed and addressed.  No physical exam was performed (except for noted visual exam findings with Video Visits).  I discussed the limitations of evaluation and management by telemedicine and the availability of in person appointments. The patient expressed understanding and agreed to proceed.  I connected with@ on 03/02/19 at  3:00 PM EDT by a video enabled telemedicine application and verified that I am speaking with the correct person using two identifiers.   Patient Location: [place] [address]   Provider location:   [facility]  Chief Complaint  Patient presents with   Establish Care    NP, Discuss Lung x-ray findings. UC visit 03/01/2019 for S.O.B.    HPI  Gwendolyn Sampson is a 68 y.o. female who presents via audio/video conferencing for a telehealth visit today.  She is seen today for cough.  She reports that she developed cough with congestion and fever.  Initial onset of symptoms was 02/13/2019.  She reports that she has been fever free for at least a week however continues to have cough.  She does feel a little short of breath if exerting herself.  She denies chest pain, nausea or vomiting, headache, palpitations, dizziness or lightheaded feeling.  She does think she may have had possible COVID exposure from someone at work prior to onset of her symptoms.   She was seen at urgent care yesterday and had a CXR completed showing what appears to be interstitial lung disease/fibrosis.  It was recommended that she have a CT scan at that time however she decided to have this work up completed outpatient rather than having CT completed in the  ER.  She denies any history of smoking or occupational exposures.    Her other medical problems include hypothyroidism that is managed by Dr. Chalmers Cater and essential tremor managed by Neuro through [facility].     ROS:  A comprehensive ROS was completed and negative except as noted per HPI  Past Medical History:  Diagnosis Date   Anxiety    Benign essential tremor    on inderal   Esotropia    Hypothyroidism    MRSA (methicillin resistant Staphylococcus aureus) 2010   wrist, waist, buttock    Past Surgical History:  Procedure Laterality Date   EYE SURGERY     cataract   LIPOMA EXCISION     on bacl   STRABISMUS SURGERY Bilateral 10/22/2017   Procedure: REPAIR STRABISMUS BILATERAL;  Surgeon: Everitt Amber, MD;  Location: [place];  Service: Ophthalmology;  Laterality: Bilateral;   TONSILLECTOMY      No family history on file.  Social History   Socioeconomic History   Marital status: Married    Spouse name: Not on file   Number of children: Not on file   Years of education: Not on file   Highest education level: Not on file  Occupational History   Not on file  Social Needs   Financial resource strain: Not on file   Food insecurity:    Worry: Not on file    Inability: Not on file   Transportation needs:    Medical: Not on file  Non-medical: Not on file  Tobacco Use   Smoking status: Never Smoker   Smokeless tobacco: Never Used  Substance and Sexual Activity   Alcohol use: No    Frequency: Never   Drug use: No   Sexual activity: Not on file  Lifestyle   Physical activity:    Days per week: Not on file    Minutes per session: Not on file   Stress: Not on file  Relationships   Social connections:    Talks on phone: Not on file    Gets together: Not on file    Attends religious service: Not on file    Active member of club or organization: Not on file    Attends meetings of clubs or organizations: Not on file     Relationship status: Not on file   Intimate partner violence:    Fear of current or ex partner: Not on file    Emotionally abused: Not on file    Physically abused: Not on file    Forced sexual activity: Not on file  Other Topics Concern   Not on file  Social History Narrative   Not on file     Current Outpatient Medications:    acetaminophen (TYLENOL) 325 MG tablet, acetaminophen, Disp: , Rfl:    aspirin EC 81 MG tablet, 162 mg every evening., Disp: , Rfl:    Docosahexaenoic Acid (DHA NATURAL OMEGA-3 PO), Take by mouth., Disp: , Rfl:    hydrOXYzine (ATARAX/VISTARIL) 10 MG tablet, Take 1 tablet (10 mg total) by mouth at bedtime as needed for itching., Disp: 12 tablet, Rfl: 0   levothyroxine (SYNTHROID, LEVOTHROID) 75 MCG tablet, levothyroxine 75 mcg tablet  TAKE 1 TABLET BY MOUTH ONCE DAILY FOR 90 DAYS, Disp: , Rfl:    multivitamin-lutein (OCUVITE-LUTEIN) CAPS capsule, Take 1 capsule by mouth daily., Disp: , Rfl:    Nutritional Supplements (JUICE PLUS FIBRE PO), Take by mouth., Disp: , Rfl:    propranolol (INDERAL) 20 MG tablet, propranolol 20 mg tablet  Take 1 tablet 3 times a day by oral route., Disp: , Rfl:    propranolol ER (INDERAL LA) 60 MG 24 hr capsule, Take 60 mg by mouth daily., Disp: , Rfl:   EXAM:  VITALS per patient if applicable: There were no vitals taken for this visit.  GENERAL: alert, oriented, appears well and in no acute distress  HEENT: atraumatic, conjunttiva clear, no obvious abnormalities on inspection of external nose and ears  NECK: normal movements of the head and neck  LUNGS: on inspection no signs of respiratory distress, breathing rate appears normal, no obvious gross SOB, gasping or wheezing  CV: no obvious cyanosis  MS: moves all visible extremities without noticeable abnormality  PSYCH/NEURO: pleasant and cooperative, no obvious depression or anxiety, speech and thought processing grossly intact  ASSESSMENT AND PLAN:  Discussed  the following assessment and plan:  Interstitial pulmonary disease ([place]) -She needs further evaluation of her abnormal chest xray.  -CT scan ordered  -? Resolving inflammatory changes related to recent illness.   Cough -Recent febrile illness with possible COVID exposure.  -She is relatively symptom free other than cough at this point.  It has been at least 7 days since initial onset of symptoms and she has been fever free >1 week and respiratory symptoms have improve.  I have ordered some baseline lab work and believe it safe for her to come in to the clinic without risk of exposing clinical staff to illness.  Hypothyroidism -Admits to some ongoing fatigue, unlikely related to hypothyroidism.  She is followed regularly by Dr. Chalmers Cater, -Additional labs ordered.   Essential tremor -stable, managed with inderal and followed by neuro.        I discussed the assessment and treatment plan with the patient. The patient was provided an opportunity to ask questions and all were answered. The patient agreed with the plan and demonstrated an understanding of the instructions.   The patient was advised to call back or seek an in-person evaluation if the symptoms worsen or if the condition fails to improve as anticipated.    Luetta Nutting, DO

## 2019-03-02 NOTE — Assessment & Plan Note (Signed)
-  stable, managed with inderal and followed by neuro.

## 2019-03-02 NOTE — Assessment & Plan Note (Signed)
-  Recent febrile illness with possible COVID exposure.  -She is relatively symptom free other than cough at this point.  It has been at least 7 days since initial onset of symptoms and she has been fever free >1 week and respiratory symptoms have improve.  I have ordered some baseline lab work and believe it safe for her to come in to the clinic without risk of exposing clinical staff to illness.

## 2019-03-07 ENCOUNTER — Telehealth: Payer: Self-pay | Admitting: Behavioral Health

## 2019-03-07 NOTE — Telephone Encounter (Signed)
Questions for Screening COVID-19  Symptom onset: 02/13/19  Travel or Contacts: No travel  During this illness, did/does the patient experience any of the following symptoms? Fever >100.72F []   Yes [x]   No []   Unknown Subjective fever (felt feverish) []   Yes [x]   No []   Unknown Chills []   Yes [x]   No []   Unknown Muscle aches (myalgia) []   Yes [x]   No []   Unknown Runny nose (rhinorrhea) []   Yes [x]   No []   Unknown Sore throat []   Yes [x]   No []   Unknown Cough (new onset or worsening of chronic cough) [x]   Yes []   No []   Unknown (pt. reports a cough w/ some phlegm, but has improved since 02/13/19; per PCP's note on 03/02/19 & patient, "it's safe for patient to come in clinic for labs".) Shortness of breath (dyspnea) []   Yes [x]   No []   Unknown Nausea or vomiting []   Yes [x]   No []   Unknown Headache []   Yes [x]   No []   Unknown Abdominal pain  []   Yes [x]   No []   Unknown Diarrhea (?3 loose/looser than normal stools/24hr period) []   Yes [x]   No []   Unknown Other, specify:  Patient risk factors: Smoker? []   Current []   Former []   Never If female, currently pregnant? []   Yes [x]   No  Patient Active Problem List   Diagnosis Date Noted  . Cough 03/02/2019  . Interstitial pulmonary disease ([place]) 03/02/2019  . Fatigue 03/02/2019  . Hypothyroidism 03/02/2019  . Essential tremor 03/02/2019  . 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 Call ahead.  []   High risk for COVID-19 but stable. Inform provider and coordinate time for [facility] visit.   []   No red flags but URI signs or symptoms okay for [facility] visit.

## 2019-03-08 ENCOUNTER — Other Ambulatory Visit (INDEPENDENT_AMBULATORY_CARE_PROVIDER_SITE_OTHER): Payer: PRIVATE HEALTH INSURANCE

## 2019-03-08 DIAGNOSIS — R05 Cough: Secondary | ICD-10-CM | POA: Diagnosis not present

## 2019-03-08 DIAGNOSIS — R5383 Other fatigue: Secondary | ICD-10-CM

## 2019-03-08 DIAGNOSIS — R059 Cough, unspecified: Secondary | ICD-10-CM

## 2019-03-08 LAB — CBC WITH DIFFERENTIAL/PLATELET
Basophils Absolute: 0.1 10*3/uL (ref 0.0–0.1)
Basophils Relative: 1.8 % (ref 0.0–3.0)
Eosinophils Absolute: 0.2 10*3/uL (ref 0.0–0.7)
Eosinophils Relative: 3 % (ref 0.0–5.0)
HCT: 40.8 % (ref 36.0–46.0)
Hemoglobin: 13.8 g/dL (ref 12.0–15.0)
Lymphocytes Relative: 25.7 % (ref 12.0–46.0)
Lymphs Abs: 2 10*3/uL (ref 0.7–4.0)
MCHC: 33.9 g/dL (ref 30.0–36.0)
MCV: 87.8 fl (ref 78.0–100.0)
Monocytes Absolute: 0.8 10*3/uL (ref 0.1–1.0)
Monocytes Relative: 9.9 % (ref 3.0–12.0)
Neutro Abs: 4.6 10*3/uL (ref 1.4–7.7)
Neutrophils Relative %: 59.6 % (ref 43.0–77.0)
Platelets: 440 10*3/uL — ABNORMAL HIGH (ref 150.0–400.0)
RBC: 4.64 Mil/uL (ref 3.87–5.11)
RDW: 13.1 % (ref 11.5–15.5)
WBC: 7.8 10*3/uL (ref 4.0–10.5)

## 2019-03-08 LAB — COMPREHENSIVE METABOLIC PANEL
ALT: 26 U/L (ref 0–35)
AST: 28 U/L (ref 0–37)
Albumin: 3.5 g/dL (ref 3.5–5.2)
Alkaline Phosphatase: 64 U/L (ref 39–117)
BUN: 10 mg/dL (ref 6–23)
CO2: 29 mEq/L (ref 19–32)
Calcium: 9.6 mg/dL (ref 8.4–10.5)
Chloride: 102 mEq/L (ref 96–112)
Creatinine, Ser: 0.81 mg/dL (ref 0.40–1.20)
GFR: 70.84 mL/min (ref >60.00)
Glucose, Bld: 88 mg/dL (ref 70–99)
Potassium: 4.2 mEq/L (ref 3.5–5.1)
Sodium: 138 mEq/L (ref 135–145)
Total Bilirubin: 0.8 mg/dL (ref 0.2–1.2)
Total Protein: 7.1 g/dL (ref 6.0–8.3)

## 2019-03-09 ENCOUNTER — Ambulatory Visit (INDEPENDENT_AMBULATORY_CARE_PROVIDER_SITE_OTHER): Payer: PRIVATE HEALTH INSURANCE | Admitting: Family Medicine

## 2019-03-09 ENCOUNTER — Encounter: Payer: Self-pay | Admitting: Family Medicine

## 2019-03-09 DIAGNOSIS — J849 Interstitial pulmonary disease, unspecified: Secondary | ICD-10-CM

## 2019-03-09 DIAGNOSIS — R21 Rash and other nonspecific skin eruption: Secondary | ICD-10-CM | POA: Diagnosis not present

## 2019-03-09 NOTE — Progress Notes (Signed)
Gwendolyn Sampson - 68 y.o. female MRN [id number]  Date of birth: 1951/08/14   This visit type was conducted due to national recommendations for restrictions regarding the COVID-19 Pandemic (e.g. social distancing).  This format is felt to be most appropriate for this patient at this time.  All issues noted in this document were discussed and addressed.  No physical exam was performed (except for noted visual exam findings with Video Visits).  I discussed the limitations of evaluation and management by telemedicine and the availability of in person appointments. The patient expressed understanding and agreed to proceed.  I connected with@ on 03/09/19 at  9:30 AM EDT by a video enabled telemedicine application and verified that I am speaking with the correct person using two identifiers.   Patient Location: [place] [address]   Provider location:   Home office  Chief Complaint  Patient presents with  . Follow-up    Review Labs, lungs,    HPI  Gwendolyn Sampson is a 68 y.o. female who presents via audio/video conferencing for a telehealth visit today.  She is following up today on recent respiratory illness.  She reports that she continues to do fairly well in regards to respiratory illness.  She has not had fever and has had improvement in respiratory symptoms.  She still has a mild cough but no shortness of breath.  She is anxious to have CT completed in regards to follow up of abnormal chest xray, however imaging could not be scheduled until June due to COVID-19 restrictions.    She also continues to have a rash on her chest that is itchy.  She was prescribed vistaril at urgent care but has not tried so far.  She has been using a topical steroid cream but isn't sure of the name of it.  She is eager to go back to work and needs a letter stating that she may.    ROS:  A comprehensive ROS was completed and negative except as noted per HPI  Past Medical History:  Diagnosis  Date  . Anxiety   . Benign essential tremor    on inderal  . Esotropia   . Hypothyroidism   . MRSA (methicillin resistant Staphylococcus aureus) 2010   wrist, waist, buttock    Past Surgical History:  Procedure Laterality Date  . EYE SURGERY     cataract  . LIPOMA EXCISION     on bacl  . STRABISMUS SURGERY Bilateral 10/22/2017   Procedure: REPAIR STRABISMUS BILATERAL;  Surgeon: Everitt Amber, MD;  Location: [place];  Service: Ophthalmology;  Laterality: Bilateral;  . TONSILLECTOMY      No family history on file.  Social History   Socioeconomic History  . Marital status: Divorced    Spouse name: Not on file  . Number of children: Not on file  . Years of education: Not on file  . Highest education level: Not on file  Occupational History  . Not on file  Social Needs  . Financial resource strain: Not on file  . Food insecurity:    Worry: Not on file    Inability: Not on file  . Transportation needs:    Medical: Not on file    Non-medical: Not on file  Tobacco Use  . Smoking status: Never Smoker  . Smokeless tobacco: Never Used  Substance and Sexual Activity  . Alcohol use: No    Frequency: Never  . Drug use: No  . Sexual activity: Not  on file  Lifestyle  . Physical activity:    Days per week: Not on file    Minutes per session: Not on file  . Stress: Not on file  Relationships  . Social connections:    Talks on phone: Not on file    Gets together: Not on file    Attends religious service: Not on file    Active member of club or organization: Not on file    Attends meetings of clubs or organizations: Not on file    Relationship status: Not on file  . Intimate partner violence:    Fear of current or ex partner: Not on file    Emotionally abused: Not on file    Physically abused: Not on file    Forced sexual activity: Not on file  Other Topics Concern  . Not on file  Social History Narrative  . Not on file     Current Outpatient  Medications:  .  acetaminophen (TYLENOL) 325 MG tablet, acetaminophen, Disp: , Rfl:  .  aspirin EC 81 MG tablet, 162 mg every evening., Disp: , Rfl:  .  Docosahexaenoic Acid (DHA NATURAL OMEGA-3 PO), Take by mouth., Disp: , Rfl:  .  hydrOXYzine (ATARAX/VISTARIL) 10 MG tablet, Take 1 tablet (10 mg total) by mouth at bedtime as needed for itching., Disp: 12 tablet, Rfl: 0 .  levothyroxine (SYNTHROID, LEVOTHROID) 75 MCG tablet, levothyroxine 75 mcg tablet  TAKE 1 TABLET BY MOUTH ONCE DAILY FOR 90 DAYS, Disp: , Rfl:  .  multivitamin-lutein (OCUVITE-LUTEIN) CAPS capsule, Take 1 capsule by mouth daily., Disp: , Rfl:  .  Nutritional Supplements (JUICE PLUS FIBRE PO), Take by mouth., Disp: , Rfl:  .  propranolol (INDERAL) 20 MG tablet, propranolol 20 mg tablet  Take 1 tablet 3 times a day by oral route., Disp: , Rfl:  .  propranolol ER (INDERAL LA) 60 MG 24 hr capsule, Take 60 mg by mouth daily., Disp: , Rfl:   EXAM:  VITALS per patient if applicable: Ht 5\' 5"  (1.651 m)   Wt 210 lb (95.3 kg) Comment: pt reports as estimate  BMI 34.95 kg/m   GENERAL: alert, oriented, appears well and in no acute distress  HEENT: atraumatic, conjunttiva clear, no obvious abnormalities on inspection of external nose and ears  NECK: normal movements of the head and neck  LUNGS: on inspection no signs of respiratory distress, breathing rate appears normal, no obvious gross SOB, gasping or wheezing  CV: no obvious cyanosis  MS: moves all visible extremities without noticeable abnormality  PSYCH/NEURO: pleasant and cooperative, no obvious depression or anxiety, speech and thought processing grossly intact  ASSESSMENT AND PLAN:  Discussed the following assessment and plan:  Interstitial pulmonary disease ([facility]) -Respiratory symptoms have improved, but still with lingering cough and fatigue.  -Will check with [facility] in [place] to see if they may be able to do CT imaging sooner.   -Reviewed recent labs  with her.    Rash -Encouraged to try vistaril for itching.  -She will let me know what topical steroid she is using at home, may be able to try a more potent steroid. She does have an appt with dermatology as well in May.        I discussed the assessment and treatment plan with the patient. The patient was provided an opportunity to ask questions and all were answered. The patient agreed with the plan and demonstrated an understanding of the instructions.   The patient was  advised to call back or seek an in-person evaluation if the symptoms worsen or if the condition fails to improve as anticipated.     Luetta Nutting, DO

## 2019-03-09 NOTE — Assessment & Plan Note (Signed)
-  Encouraged to try vistaril for itching.  -She will let me know what topical steroid she is using at home, may be able to try a more potent steroid. She does have an appt with dermatology as well in May.

## 2019-03-09 NOTE — Assessment & Plan Note (Signed)
-  Respiratory symptoms have improved, but still with lingering cough and fatigue.  -Will check with [facility] in [place] to see if they may be able to do CT imaging sooner.   -Reviewed recent labs with her.

## 2019-03-10 NOTE — Progress Notes (Signed)
Completed 03/09/2019

## 2019-03-15 ENCOUNTER — Encounter: Payer: Self-pay | Admitting: Family Medicine

## 2019-03-15 ENCOUNTER — Other Ambulatory Visit: Payer: Self-pay | Admitting: Family Medicine

## 2019-03-15 DIAGNOSIS — Z13220 Encounter for screening for lipoid disorders: Secondary | ICD-10-CM

## 2019-03-15 DIAGNOSIS — I7090 Unspecified atherosclerosis: Secondary | ICD-10-CM

## 2019-03-15 DIAGNOSIS — J849 Interstitial pulmonary disease, unspecified: Secondary | ICD-10-CM

## 2019-03-15 NOTE — Telephone Encounter (Signed)
Sent mychart message to patient that form is ready to pick up.  She needs to have lipid panel too.  She can have this completed when she comes to pick up the form.

## 2019-03-16 ENCOUNTER — Encounter: Payer: Self-pay | Admitting: Family Medicine

## 2019-03-16 ENCOUNTER — Other Ambulatory Visit: Payer: Self-pay | Admitting: Family Medicine

## 2019-03-16 MED ORDER — BETAMETHASONE DIPROPIONATE 0.05 % EX CREA: TOPICAL_CREAM | Freq: Two times a day (BID) | CUTANEOUS | 0 refills | Status: DC

## 2019-03-16 NOTE — Telephone Encounter (Signed)
Medication refilled

## 2019-03-17 ENCOUNTER — Other Ambulatory Visit (INDEPENDENT_AMBULATORY_CARE_PROVIDER_SITE_OTHER): Payer: PRIVATE HEALTH INSURANCE

## 2019-03-17 ENCOUNTER — Encounter: Payer: Self-pay | Admitting: Family Medicine

## 2019-03-17 DIAGNOSIS — Z13220 Encounter for screening for lipoid disorders: Secondary | ICD-10-CM | POA: Diagnosis not present

## 2019-03-17 DIAGNOSIS — I7090 Unspecified atherosclerosis: Secondary | ICD-10-CM | POA: Diagnosis not present

## 2019-03-17 LAB — LIPID PANEL
Cholesterol: 234 mg/dL — ABNORMAL HIGH (ref 0–200)
HDL: 68.6 mg/dL (ref >39.00)
LDL Cholesterol: 144 mg/dL — ABNORMAL HIGH (ref 0–99)
NonHDL: 165.67
Total CHOL/HDL Ratio: 3
Triglycerides: 106 mg/dL (ref 0.0–149.0)
VLDL: 21.2 mg/dL (ref 0.0–40.0)

## 2019-03-17 NOTE — Progress Notes (Signed)
Patient and lab staff wore face masks for visit per COVID-19 protocol.  

## 2019-03-20 ENCOUNTER — Encounter: Payer: Self-pay | Admitting: Family Medicine

## 2019-03-20 ENCOUNTER — Other Ambulatory Visit: Payer: Self-pay | Admitting: Family Medicine

## 2019-03-20 MED ORDER — ATORVASTATIN CALCIUM 20 MG PO TABS: 20.0000 mg | ORAL_TABLET | Freq: Every day | ORAL | 1 refills | Status: DC

## 2019-03-21 NOTE — Telephone Encounter (Signed)
Yes, I would recommend prevnar 13 now with pneumovax 23 revaccination in 1 year.  I would also recommend the newer shingles vaccine as well.  Please let her know it is a series of 2 shots.  The second injection is given 2-6 months after the first.  Thanks!

## 2019-03-22 ENCOUNTER — Encounter: Payer: Self-pay | Admitting: Family Medicine

## 2019-03-23 NOTE — Telephone Encounter (Signed)
We can do them in the office.  Whichever day works for her is fine.  I can order the antibody test, however it does not tell us if she has immunity only if she has been exposed.

## 2019-03-24 ENCOUNTER — Ambulatory Visit (INDEPENDENT_AMBULATORY_CARE_PROVIDER_SITE_OTHER): Payer: PRIVATE HEALTH INSURANCE

## 2019-03-24 DIAGNOSIS — Z23 Encounter for immunization: Secondary | ICD-10-CM

## 2019-03-24 NOTE — Progress Notes (Signed)
Per 4.27.20 note by Luetta Nutting, DO ok for pt to receive PNV-13 & Shingrix #1.  She discussed with her insurance company and they said that Shingrix is covered at 100% as preventative in the providers office. After obtaining informed consent, the immunization is given by Luetta Nutting, DO Singrix #1 given in left deltoid and PNV-13 given in right deltoid/she is aware that she will need to get Shingrix #2 in 2-6 months and get PNV-23 in 1 year.

## 2019-03-30 ENCOUNTER — Other Ambulatory Visit: Payer: Self-pay | Admitting: Family Medicine

## 2019-03-30 DIAGNOSIS — Z20822 Contact with and (suspected) exposure to covid-19: Secondary | ICD-10-CM

## 2019-03-30 DIAGNOSIS — R6889 Other general symptoms and signs: Principal | ICD-10-CM

## 2019-03-30 NOTE — Progress Notes (Signed)
igg

## 2019-03-30 NOTE — Telephone Encounter (Signed)
Orders entered for antibody testing. Please check on status of pulmonology referral.

## 2019-03-31 ENCOUNTER — Other Ambulatory Visit: Payer: PRIVATE HEALTH INSURANCE

## 2019-03-31 DIAGNOSIS — Z20822 Contact with and (suspected) exposure to covid-19: Secondary | ICD-10-CM

## 2019-03-31 NOTE — Addendum Note (Signed)
Addended by: Lynnea Ferrier on: 03/31/2019 01:12 PM   Modules accepted: Orders

## 2019-03-31 NOTE — Addendum Note (Signed)
Addended by: Lynnea Ferrier on: 03/31/2019 01:22 PM   Modules accepted: Orders

## 2019-04-01 LAB — SAR COV2 SEROLOGY (COVID19)AB(IGG),IA: SARS-CoV-2 Ab, IgG: POSITIVE

## 2019-04-03 NOTE — Progress Notes (Signed)
COVID antibody testing is positive. This along with symptoms she was experiencing indicates she very likely had previous infection.  This does not necessarily mean she has immunity as levels for this have not been established.

## 2019-04-19 ENCOUNTER — Ambulatory Visit (INDEPENDENT_AMBULATORY_CARE_PROVIDER_SITE_OTHER): Payer: PRIVATE HEALTH INSURANCE | Admitting: Internal Medicine

## 2019-04-19 ENCOUNTER — Encounter: Payer: Self-pay | Admitting: Internal Medicine

## 2019-04-19 ENCOUNTER — Other Ambulatory Visit: Payer: Self-pay

## 2019-04-19 DIAGNOSIS — J849 Interstitial pulmonary disease, unspecified: Secondary | ICD-10-CM

## 2019-04-19 NOTE — Progress Notes (Signed)
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 Viral exanthum / fever gone completely by around February 22 2019 but bothered by persistent cough esp am clear mucus, breathing ok x hills does ok flat, sob talking, fatigue referred to pulmonary clinic 04/19/2019 by Gwendolyn Sampson.     History of Present Illness  04/19/2019  Pulmonary/ 1st office eval/Gwendolyn Sampson  Chief Complaint  Patient presents with  . Consult    abnormal xray and CT, patient states Covid-19 has done damage to her lungs  Dyspnea: MMRC1 = can walk nl pace, flat grade, can't hurry or go uphills or steps s sob   Cough: in am's min mucoid now, was 24/7 copious  Mucus never purulent Sleep: able to sleep flat / one pillow  SABA use: none   No obvious day to day or daytime variability or assoc excess/ purulent sputum or mucus plugs or hemoptysis or cp or chest tightness, subjective wheeze or overt sinus or hb symptoms.   sleeping without nocturnal  or early am exacerbation  of respiratory  c/o's or need for noct saba. Also denies any obvious fluctuation of symptoms with weather or environmental changes or other aggravating or alleviating factors except as outlined above   No unusual exposure hx or h/o childhood pna/ asthma or knowledge of premature birth.  Current Allergies, Complete Past Medical History, Past Surgical History, Family History, and Social History were reviewed in [employer] record.  ROS  The following are not active complaints unless bolded Hoarseness, sore throat, dysphagia, dental problems, itching, sneezing,  nasal congestion or discharge of excess mucus or purulent secretions, ear ache,   fever, chills, sweats,  unintended wt loss or wt gain, classically pleuritic or exertional cp,  orthopnea pnd or arm/hand swelling  or leg swelling, presyncope, palpitations, abdominal pain, anorexia, nausea, vomiting, diarrhea  or change in bowel habits or change in bladder habits, change in stools or change in urine, dysuria, hematuria,  rash, arthralgias, visual complaints, headache, numbness, weakness or ataxia or problems with walking or coordination,  change in mood or  memory.           Past Medical History:  Diagnosis Date  . Anxiety   . Benign essential tremor    on inderal  . Esotropia   . Hypothyroidism   . MRSA (methicillin resistant Staphylococcus aureus) 2010   wrist, waist, buttock    Outpatient Medications Prior to Visit  Medication Sig Dispense Refill  . acetaminophen (TYLENOL) 325 MG tablet acetaminophen    . aspirin EC 81 MG tablet 81 mg. One every other day    . atorvastatin (LIPITOR) 20 MG tablet Take 1 tablet (20 mg total) by mouth daily. 90 tablet 1  . Biotin 1000 MCG CHEW Chew 1,000 mcg by mouth daily.    . Cholecalciferol (VITAMIN D-3) 25 MCG (1000 UT) CAPS Take by mouth.    . Docosahexaenoic Acid (DHA NATURAL OMEGA-3 PO) Take by mouth.    Javier Docker Oil (OMEGA-3) 500 MG CAPS Take 500 mg by mouth daily.    Marland Kitchen levothyroxine (SYNTHROID, LEVOTHROID) 75 MCG tablet levothyroxine 75 mcg tablet  TAKE 1 TABLET BY MOUTH ONCE DAILY FOR 90  DAYS    . multivitamin-lutein (OCUVITE-LUTEIN) CAPS capsule Take 1 capsule by mouth daily.    . Nutritional Supplements (JUICE PLUS FIBRE PO) Take by mouth.    . propranolol (INDERAL) 20 MG tablet propranolol 20 mg tablet  Take 1 tablet 3 times a day by oral route.    . propranolol ER (INDERAL LA) 60 MG 24 hr capsule Take 60 mg by mouth daily.    Marland Kitchen pyridoxine (B-6) 100 MG tablet Take 100 mg by mouth daily.    . Vitamin E 180 MG CAPS Take 180 mg by mouth daily.    . betamethasone dipropionate (DIPROLENE) 0.05 % cream Apply topically 2 (two) times daily. (Patient  not taking: Reported on 04/19/2019) 45 g 0  . hydrOXYzine (ATARAX/VISTARIL) 10 MG tablet Take 1 tablet (10 mg total) by mouth at bedtime as needed for itching. (Patient not taking: Reported on 04/19/2019) 12 tablet 0   No facility-administered medications prior to visit.      Objective:     BP 122/82 (BP Location: Right Arm, Cuff Size: Normal)   Pulse 70   Temp 98 F (36.7 C) (Oral)   Ht 5\' 5"  (1.651 m)   Wt 201 lb (91.2 kg)   SpO2 98%   BMI 33.45 kg/m   SpO2: 98 %  RA   HEENT: nl dentition, turbinates bilaterally, and oropharynx. Nl external ear canals without cough reflex   NECK :  without JVD/Nodes/TM/ nl carotid upstrokes bilaterally   LUNGS: no acc muscle use,  Nl contour chest which is clear to A and P bilaterally without cough on insp or exp maneuvers   CV:  RRR  no s3 or murmur or increase in P2, and no edema   ABD:  soft and nontender with nl inspiratory excursion in the supine position. No bruits or organomegaly appreciated, bowel sounds nl  MS:  Nl gait/ ext warm without deformities, calf tenderness, cyanosis or clubbing No obvious joint restrictions   SKIN: warm and dry with improving macular erythrymatous rash/ no open wounds  NEURO:  alert, approp, nl sensorium with  no motor or cerebellar deficits apparent - resting tremor noted      I personally reviewed images and agree with radiology impression as follows:  CXR:   03/01/2019  Interstitial prominence throughout the lungs, right greater than left, favor chronic interstitial lung disease/fibrosis. Without old studies, it is difficult to completely exclude superimposed acute process.      I personally reviewed images and agree with radiology impression as follows:   Chest HRCT 03/09/2019 Result Impression  IMPRESSION: 1. No typical groundglass type peripheral infiltrates to suggest viral pneumonia 2. Peripheral opacities in all lobes of both lungs with an upper lobe predominance, presumed fibrosis  given the history of interstitial lung disease. Strictly speaking, infectious infiltrates cannot be excluded without COMPARISON with old studies. 3. No adenopathy or effusion 4. Coronary and aortic ASVD       Assessment   Interstitial pulmonary disease ([place]) Onset of symptoms  March 23/2020  - HRCT 03/09/19 1. No typical groundglass type peripheral infiltrates to suggest viral pneumonia 2. Peripheral opacities in all lobes of both lungs with an upper lobe predominance, presumed fibrosis given the history of interstitial lung disease. - Covid AB  03/31/2019  Positive (never swabbed)  - 04/19/2019   Walked RA  2 laps @  approx 263ft each @ very fast pace  stopped due to  sats down to 85%     Clearly still  experiencing the sequelae of recent covid 19 infection but at this point all her symptoms have vastly improved now on Day 35 of the illness with not enough data on the recovering population to say whether her course is typical or an "outlier" but I strongly suspect this is a normal course and doubt she'll have any more long term consequences in terms of the fibrotic phase of the illness than what we usually see in other forms of ALI (in the non-ards popbulation)  >>>In any case would not be tempted to give steroids here given all we don't know about covid 19 and plan to check another xray in  A week (around day 42) and let her continue paced activity/ return to work s restrictions.    Total time devoted to counseling  > 50 % of initial 60 min office visit:  reviewed case with pt/ / directly observed portions of ambulatory 02 saturation study/ discussion of options/alternatives/ personally creating written customized instructions  in presence of pt  then going over those specific  Instructions directly with the pt including how to use all of the meds but in particular covering each new medication in detail and the difference between the maintenance= "automatic" meds and the prns using an action plan  format for the latter (If this problem/symptom => do that organization reading Left to right).  Please see AVS from this visit for a full list of these instructions which I personally wrote for this pt and  are unique to this visit.   Christinia Gully, MD 04/19/2019

## 2019-04-19 NOTE — Patient Instructions (Addendum)
Make sure the cxr next week is on canopy and the give [place] the date you get it and I will let you know whether tests are needed.  You should be back to 100% of normal in 3 months or return to clinic.

## 2019-04-20 ENCOUNTER — Encounter: Payer: Self-pay | Admitting: Internal Medicine

## 2019-04-20 NOTE — Assessment & Plan Note (Signed)
Onset of symptoms  March 23/2020  - HRCT 03/09/19 1. No typical groundglass type peripheral infiltrates to suggest viral pneumonia 2. Peripheral opacities in all lobes of both lungs with an upper lobe predominance, presumed fibrosis given the history of interstitial lung disease. - Covid AB  03/31/2019  Positive (never swabbed)  - 04/19/2019   Walked RA  2 laps @  approx 24ft each @ very fast pace  stopped due to  sats down to 85%     Clearly still experiencing the sequelae of recent covid 19 infection but at this point all her symptoms have vastly improved now on Day 35 of the illness with not enough data on the recovering population to say whether her course is typical or an "outlier" but I strongly suspect this is a normal course and doubt she'll have any more long term consequences in terms of the fibrotic phase of the illness than what we usually see in other forms of ALI (in the non-ards popbulation)  >>>In any case would not be tempted to give steroids here given all we don't know about covid 19 and plan to check another xray in  A week (around day 42) and let her continue paced activity/ return to work s restrictions.   Total time devoted to counseling  > 50 % of initial 60 min office visit:  reviewed case with pt/ / directly observed portions of ambulatory 02 saturation study/ discussion of options/alternatives/ personally creating written customized instructions  in presence of pt  then going over those specific  Instructions directly with the pt including how to use all of the meds but in particular covering each new medication in detail and the difference between the maintenance= "automatic" meds and the prns using an action plan format for the latter (If this problem/symptom => do that organization reading Left to right).  Please see AVS from this visit for a full list of these instructions which I personally wrote for this pt and  are unique to this visit.

## 2019-04-21 ENCOUNTER — Institutional Professional Consult (permissible substitution): Payer: PRIVATE HEALTH INSURANCE | Admitting: Internal Medicine

## 2019-04-26 ENCOUNTER — Telehealth: Payer: Self-pay | Admitting: *Deleted

## 2019-04-26 NOTE — Telephone Encounter (Signed)
LMTCB

## 2019-04-26 NOTE — Telephone Encounter (Signed)
-----   Message from Tanda Rockers, MD sent at 04/26/2019 12:07 PM EDT ----- Let her know I looked at cxr and it is vastly improved vs priors so no additional w/u needed  - would like to see back x one last ov in 4 weeks re-examine and check sats on RA walking for new baseline and decide whether full pfts indicated but should not be doing them now due to covid restrictions unless some compelling reason to do so and I don't see one

## 2019-04-27 NOTE — Telephone Encounter (Signed)
Returned pt call and made aware results of cxr. Also advised of requested 4 week f/u to rechecked Sats on RA. Appt scheduled. Nothing further needed.

## 2019-04-27 NOTE — Telephone Encounter (Signed)
Pt is calling back [phone]

## 2019-05-31 ENCOUNTER — Other Ambulatory Visit: Payer: Self-pay

## 2019-05-31 ENCOUNTER — Ambulatory Visit: Payer: PRIVATE HEALTH INSURANCE | Admitting: Internal Medicine

## 2019-05-31 ENCOUNTER — Ambulatory Visit (INDEPENDENT_AMBULATORY_CARE_PROVIDER_SITE_OTHER): Payer: PRIVATE HEALTH INSURANCE

## 2019-05-31 ENCOUNTER — Encounter: Payer: Self-pay | Admitting: Internal Medicine

## 2019-05-31 DIAGNOSIS — J849 Interstitial pulmonary disease, unspecified: Secondary | ICD-10-CM

## 2019-05-31 NOTE — Progress Notes (Signed)
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 Viral exanthum / fever gone completely by around February 22 2019 but bothered by persistent cough esp am clear mucus, breathing ok x hills does ok flat, sob talking, fatigue referred to pulmonary clinic 04/19/2019 by Luetta Nutting.     History of Present Illness  04/19/2019  Pulmonary/ 1st office eval/Ndeye Tenorio  Chief Complaint  Patient presents with  . Consult    abnormal xray and CT, patient states Covid-19 has done damage to her lungs  Dyspnea: MMRC1 = can walk nl pace, flat grade, can't hurry or go uphills or steps s sob   Cough: in am's min mucoid now, was 24/7 copious  Mucus never purulent Sleep: able to sleep flat / one pillow  SABA use: none  rec F/u 4 weeks/ no rx      05/31/2019  f/u ov/Maleek Craver re: f/u covid 19 / rx outpt only  Chief Complaint  Patient presents with  . Follow-up    Breathing doing well and no new co's.    Dyspnea:  Back to baseline including lap swimming / everything she was at the same pace Cough: none Sleeping: fine  SABA use: none  02: none    No obvious day to day or daytime variability or assoc excess/ purulent sputum or mucus plugs or hemoptysis or cp or chest tightness, subjective wheeze or overt sinus or hb symptoms.   sleeping without nocturnal  or early am exacerbation  of respiratory  c/o's or need for noct saba. Also denies any obvious fluctuation of symptoms with weather or environmental changes or other aggravating or alleviating factors except as outlined above   No unusual exposure hx or h/o childhood pna/ asthma or knowledge of premature birth.  Current Allergies, Complete Past Medical History, Past Surgical History,  Family History, and Social History were reviewed in [employer] record.  ROS  The following are not active complaints unless bolded Hoarseness, sore throat, dysphagia, dental problems, itching, sneezing,  nasal congestion or discharge of excess mucus or purulent secretions, ear ache,   fever, chills, sweats, unintended wt loss or wt gain, classically pleuritic or exertional cp,  orthopnea pnd or arm/hand swelling  or leg swelling, presyncope, palpitations, abdominal pain, anorexia, nausea, vomiting, diarrhea  or change in bowel habits or change in bladder habits, change in stools or change in urine, dysuria, hematuria,  rash, arthralgias, visual complaints, headache, numbness, weakness or ataxia or problems with walking or coordination,  change in mood or  memory.        Current Meds  Medication Sig  . acetaminophen (TYLENOL) 325 MG tablet acetaminophen  . aspirin EC 81 MG tablet 81 mg. One every other day  . atorvastatin (LIPITOR) 20 MG tablet Take 1 tablet (20 mg total) by mouth daily.  . betamethasone dipropionate (DIPROLENE) 0.05 % cream Apply topically 2 (two) times daily.  . Biotin 1000 MCG CHEW Chew 1,000 mcg by mouth daily.  . Cholecalciferol (VITAMIN D-3) 25 MCG (1000 UT) CAPS Take by mouth.  . Docosahexaenoic Acid (DHA NATURAL OMEGA-3 PO) Take by mouth.  . hydrOXYzine (ATARAX/VISTARIL) 10 MG tablet Take 1 tablet (10 mg total) by  mouth at bedtime as needed for itching.  Javier Docker Oil (OMEGA-3) 500 MG CAPS Take 500 mg by mouth daily.  Marland Kitchen levothyroxine (SYNTHROID, LEVOTHROID) 75 MCG tablet levothyroxine 75 mcg tablet  TAKE 1 TABLET BY MOUTH ONCE DAILY FOR 90 DAYS  . multivitamin-lutein (OCUVITE-LUTEIN) CAPS capsule Take 1 capsule by mouth daily.  . Nutritional Supplements (JUICE PLUS FIBRE PO) Take by mouth.  . propranolol (INDERAL) 20 MG tablet propranolol 20 mg tablet  Take 1 tablet 3 times a day by oral route.  . propranolol ER (INDERAL LA) 60 MG 24 hr capsule  Take 60 mg by mouth daily.  Marland Kitchen pyridoxine (B-6) 100 MG tablet Take 100 mg by mouth daily.  . Vitamin E 180 MG CAPS Take 180 mg by mouth daily.                   Past Medical History:  Diagnosis Date  . Anxiety   . Benign essential tremor    on inderal  . Esotropia   . Hypothyroidism   . MRSA (methicillin resistant Staphylococcus aureus) 2010   wrist, waist, buttock       Objective:    amb wf nad    Wt Readings from Last 3 Encounters:  05/31/19 200 lb (90.7 kg)  04/19/19 201 lb (91.2 kg)  03/09/19 210 lb (95.3 kg)     Vital signs reviewed - Note on arrival 02 sats  96% on RA  HEENT: nl dentition, turbinates bilaterally, and oropharynx. Nl external ear canals without cough reflex   NECK :  without JVD/Nodes/TM/ nl carotid upstrokes bilaterally   LUNGS: no acc muscle use,  Nl contour chest which is clear to A and P bilaterally without cough on insp or exp maneuvers   CV:  RRR  no s3 or murmur or increase in P2, and no edema   ABD:  soft and nontender with nl inspiratory excursion in the supine position. No bruits or organomegaly appreciated, bowel sounds nl  MS:  Nl gait/ ext warm without deformities, calf tenderness, cyanosis or clubbing No obvious joint restrictions   SKIN: warm and dry without lesions    NEURO:  alert, approp, nl sensorium with  no motor or cerebellar deficits apparent.          CXR PA and Lateral:   05/31/2019 :    I personally reviewed images and agree with radiology impression as follows:    The interstitial opacities on the previous study have resolved suggesting the opacities were infectious. No signs of current infiltrate or infection.        Assessment

## 2019-05-31 NOTE — Progress Notes (Signed)
Spoke with pt and notified of results per Dr. Wert. Pt verbalized understanding and denied any questions. 

## 2019-05-31 NOTE — Patient Instructions (Signed)
Please remember to go to the  x-ray department  for your tests - we will call you with the results when they are available    Pulmonary follow up is as needed

## 2019-05-31 NOTE — Assessment & Plan Note (Signed)
Onset of symptoms  February 13 2019  - HRCT 03/09/19 1. No typical groundglass type peripheral infiltrates to suggest viral pneumonia 2. Peripheral opacities in all lobes of both lungs with an upper lobe predominance, presumed fibrosis given the history of interstitial lung disease. - Covid IgG   03/31/2019  Positive   - 04/19/2019   Walked RA  2 laps @  approx 244ft each @ very fast pace  stopped due to  sats down to 85%   - cxr 04/24/2019 minimal basilar streaky atx L >R  -05/31/2019   Walked RA  2 laps @  approx 261ft each @ fast pace  stopped due to end of study, sats still 95% s sob - CXR PA and Lateral:   05/31/2019 :  Resolved, no f/u planned      Complete clinical and radiographic resolution of Covid 19 pulmonary dz with no sequelae - reminded pt we don't know if she's immune at this point and to continue to wear mask, social distance per cdc and local government guidelines, 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   Pulmonary f/u is prn

## 2019-06-10 ENCOUNTER — Other Ambulatory Visit: Payer: Self-pay | Admitting: Endocrinology

## 2019-06-10 DIAGNOSIS — E049 Nontoxic goiter, unspecified: Secondary | ICD-10-CM

## 2019-06-16 ENCOUNTER — Ambulatory Visit
Admission: RE | Admit: 2019-06-16 | Discharge: 2019-06-16 | Disposition: A | Discharge status: Home / Self Care | Payer: PRIVATE HEALTH INSURANCE | Source: Ambulatory Visit | Attending: Endocrinology | Admitting: Endocrinology

## 2019-06-16 DIAGNOSIS — E049 Nontoxic goiter, unspecified: Secondary | ICD-10-CM

## 2019-08-13 ENCOUNTER — Encounter: Payer: Self-pay | Admitting: Family Medicine

## 2019-08-16 ENCOUNTER — Other Ambulatory Visit: Payer: Self-pay

## 2019-08-17 ENCOUNTER — Ambulatory Visit (INDEPENDENT_AMBULATORY_CARE_PROVIDER_SITE_OTHER): Payer: PRIVATE HEALTH INSURANCE

## 2019-08-17 DIAGNOSIS — Z23 Encounter for immunization: Secondary | ICD-10-CM | POA: Diagnosis not present

## 2019-08-17 NOTE — Progress Notes (Signed)
Pt came into the office to get second shingle shot, gave injection to the right deltoid, pt tolerated injection well. Information offer the pt.

## 2019-08-28 ENCOUNTER — Encounter: Payer: Self-pay | Admitting: Family Medicine

## 2019-08-31 ENCOUNTER — Other Ambulatory Visit: Payer: Self-pay | Admitting: Family Medicine

## 2019-08-31 DIAGNOSIS — E785 Hyperlipidemia, unspecified: Secondary | ICD-10-CM

## 2019-08-31 MED ORDER — ROSUVASTATIN CALCIUM 10 MG PO TABS: 10.0000 mg | ORAL_TABLET | Freq: Every day | ORAL | 3 refills | Status: DC

## 2019-09-20 ENCOUNTER — Telehealth: Payer: Self-pay

## 2019-09-20 NOTE — Telephone Encounter (Signed)

## 2019-09-21 ENCOUNTER — Other Ambulatory Visit (INDEPENDENT_AMBULATORY_CARE_PROVIDER_SITE_OTHER): Payer: PRIVATE HEALTH INSURANCE

## 2019-09-21 ENCOUNTER — Other Ambulatory Visit: Payer: Self-pay

## 2019-09-21 DIAGNOSIS — E785 Hyperlipidemia, unspecified: Secondary | ICD-10-CM | POA: Diagnosis not present

## 2019-09-21 LAB — COMPREHENSIVE METABOLIC PANEL
ALT: 16 U/L (ref 0–35)
AST: 19 U/L (ref 0–37)
Albumin: 4.3 g/dL (ref 3.5–5.2)
Alkaline Phosphatase: 69 U/L (ref 39–117)
BUN: 20 mg/dL (ref 6–23)
CO2: 30 mEq/L (ref 19–32)
Calcium: 9.6 mg/dL (ref 8.4–10.5)
Chloride: 107 mEq/L (ref 96–112)
Creatinine, Ser: 0.83 mg/dL (ref 0.40–1.20)
GFR: 68.76 mL/min (ref >60.00)
Glucose, Bld: 82 mg/dL (ref 70–99)
Potassium: 4.8 mEq/L (ref 3.5–5.1)
Sodium: 142 mEq/L (ref 135–145)
Total Bilirubin: 1.1 mg/dL (ref 0.2–1.2)
Total Protein: 6.6 g/dL (ref 6.0–8.3)

## 2019-09-21 LAB — LIPID PANEL
Cholesterol: 162 mg/dL (ref 0–200)
HDL: 64.8 mg/dL (ref >39.00)
LDL Cholesterol: 86 mg/dL (ref 0–99)
NonHDL: 97.64
Total CHOL/HDL Ratio: 3
Triglycerides: 58 mg/dL (ref 0.0–149.0)
VLDL: 11.6 mg/dL (ref 0.0–40.0)

## 2019-11-15 ENCOUNTER — Ambulatory Visit
Admission: RE | Admit: 2019-11-15 | Discharge: 2019-11-15 | Disposition: A | Discharge status: Home / Self Care | Payer: Self-pay | Source: Ambulatory Visit | Attending: Internal Medicine | Admitting: Internal Medicine

## 2019-11-15 ENCOUNTER — Other Ambulatory Visit: Payer: Self-pay

## 2019-11-15 DIAGNOSIS — J849 Interstitial pulmonary disease, unspecified: Secondary | ICD-10-CM

## 2020-04-19 ENCOUNTER — Other Ambulatory Visit: Payer: Self-pay | Admitting: Endocrinology

## 2020-04-19 DIAGNOSIS — E049 Nontoxic goiter, unspecified: Secondary | ICD-10-CM

## 2020-05-02 ENCOUNTER — Ambulatory Visit
Admission: RE | Admit: 2020-05-02 | Discharge: 2020-05-02 | Disposition: A | Discharge status: Home / Self Care | Payer: PRIVATE HEALTH INSURANCE | Source: Ambulatory Visit | Attending: Endocrinology | Admitting: Endocrinology

## 2020-05-02 DIAGNOSIS — E049 Nontoxic goiter, unspecified: Secondary | ICD-10-CM

## 2020-08-09 ENCOUNTER — Ambulatory Visit (INDEPENDENT_AMBULATORY_CARE_PROVIDER_SITE_OTHER): Payer: PRIVATE HEALTH INSURANCE | Admitting: Nurse Practitioner

## 2020-08-09 ENCOUNTER — Encounter: Payer: Self-pay | Admitting: Nurse Practitioner

## 2020-08-09 ENCOUNTER — Other Ambulatory Visit: Payer: Self-pay

## 2020-08-09 VITALS — BP 110/80 | HR 70 | Temp 97.7°F | Ht 65.0 in | Wt 199.6 lb

## 2020-08-09 DIAGNOSIS — Z23 Encounter for immunization: Secondary | ICD-10-CM | POA: Diagnosis not present

## 2020-08-09 DIAGNOSIS — E039 Hypothyroidism, unspecified: Secondary | ICD-10-CM

## 2020-08-09 DIAGNOSIS — E785 Hyperlipidemia, unspecified: Secondary | ICD-10-CM

## 2020-08-09 DIAGNOSIS — G250 Essential tremor: Secondary | ICD-10-CM

## 2020-08-09 MED ORDER — ROSUVASTATIN CALCIUM 10 MG PO TABS: 10.0000 mg | ORAL_TABLET | Freq: Every day | ORAL | 1 refills | Status: DC

## 2020-08-09 NOTE — Assessment & Plan Note (Signed)
Managed by [facility] neurology with propanolol Last OV within 10mos

## 2020-08-09 NOTE — Assessment & Plan Note (Signed)
Managed by Dr. Chalmers Cater with [facility]. Last OV within 65mos.

## 2020-08-09 NOTE — Patient Instructions (Addendum)
Schedule lab appt. Need to be fasting prior to blood draw (6-8hrs).  Have a safe trip.  Sign medical release to get records from Dr. Collene Mares and from [place].

## 2020-08-09 NOTE — Progress Notes (Signed)
Subjective:  Patient ID: Gwendolyn Sampson, female    DOB: [birth year]  Age: 69 y.o. MRN: [id number]  CC: Establish Care (TOC-Dr. Matthews/Medication refills needed (Rosuvastatin 10mg ))  HPI  Hypothyroidism Managed by Dr. Chalmers Cater with [facility]. Last OV within 63mos.  Benign head tremor Managed by [facility] neurology with propanolol Last OV within 64mos  Hyperlipidemia Needs repeat lipid panel and CMP Maintain current crestor dose while waiting for lab results  Reviewed past Medical, Social and Family history today.  Outpatient Medications Prior to Visit  Medication Sig Dispense Refill  . acetaminophen (TYLENOL) 325 MG tablet acetaminophen    . aspirin EC 81 MG tablet 81 mg. One every other day    . Biotin 1000 MCG CHEW Chew 1,000 mcg by mouth daily.    . Cholecalciferol (VITAMIN D-3) 25 MCG (1000 UT) CAPS Take by mouth.    . Docosahexaenoic Acid (DHA NATURAL OMEGA-3 PO) Take by mouth.    Gwendolyn Sampson Oil (OMEGA-3) 500 MG CAPS Take 500 mg by mouth daily.    Marland Kitchen levothyroxine (SYNTHROID, LEVOTHROID) 75 MCG tablet levothyroxine 75 mcg tablet  TAKE 1 TABLET BY MOUTH ONCE DAILY FOR 90 DAYS    . multivitamin-lutein (OCUVITE-LUTEIN) CAPS capsule Take 1 capsule by mouth daily.    . Nutritional Supplements (JUICE PLUS FIBRE PO) Take by mouth.    . propranolol (INDERAL) 20 MG tablet propranolol 20 mg tablet  Take 1 tablet 3 times a day by oral route.    . propranolol ER (INDERAL LA) 60 MG 24 hr capsule Take 60 mg by mouth daily.    Marland Kitchen pyridoxine (B-6) 100 MG tablet Take 100 mg by mouth daily.    . Vitamin E 180 MG CAPS Take 180 mg by mouth daily.    . rosuvastatin (CRESTOR) 10 MG tablet Take 1 tablet (10 mg total) by mouth daily. 90 tablet 3  . betamethasone dipropionate (DIPROLENE) 0.05 % cream Apply topically 2 (two) times daily. (Patient not taking: Reported on 08/09/2020) 45 g 0  . hydrOXYzine (ATARAX/VISTARIL) 10 MG tablet Take 1 tablet (10 mg total) by mouth at bedtime as needed for itching. (Patient not  taking: Reported on 08/09/2020) 12 tablet 0   No facility-administered medications prior to visit.    ROS See HPI  Objective:  BP 110/80 (BP Location: Left Arm, Patient Position: Sitting, Cuff Size: Normal)   Pulse 70   Temp 97.7 F (36.5 C) (Temporal)   Ht 5\' 5"  (1.651 m)   Wt 199 lb 9.6 oz (90.5 kg)   SpO2 96%   BMI 33.22 kg/m   Physical Exam Constitutional:      Appearance: She is obese.  Cardiovascular:     Rate and Rhythm: Normal rate and regular rhythm.     Pulses: Normal pulses.     Heart sounds: Normal heart sounds.  Pulmonary:     Effort: Pulmonary effort is normal.     Breath sounds: Normal breath sounds.  Neurological:     Mental Status: She is alert and oriented to person, place, and time.     Assessment & Plan:  This visit occurred during the SARS-CoV-2 public health emergency.  Safety protocols were in place, including screening questions prior to the visit, additional usage of staff PPE, and extensive cleaning of exam room while observing appropriate contact time as indicated for disinfecting solutions.   Gwendolyn Sampson was seen today for establish care.  Diagnoses and all orders for this visit:  Hyperlipidemia, unspecified hyperlipidemia type -  Lipid panel; Future -     Comprehensive metabolic panel; Future -     rosuvastatin (CRESTOR) 10 MG tablet; Take 1 tablet (10 mg total) by mouth daily.  Influenza vaccine needed -     Flu Vaccine QUAD High Dose(Fluad)  Acquired hypothyroidism  Benign head tremor    Problem List Items Addressed This Visit      Endocrine   Hypothyroidism    Managed by Dr. Chalmers Cater with [facility]. Last OV within 93mos.        Nervous and Auditory   Benign head tremor    Managed by [facility] neurology with propanolol Last OV within 46mos        Other   Hyperlipidemia - Primary    Needs repeat lipid panel and CMP Maintain current crestor dose while waiting for lab results      Relevant Medications   rosuvastatin (CRESTOR) 10 MG  tablet   Other Relevant Orders   Lipid panel   Comprehensive metabolic panel    Other Visit Diagnoses    Influenza vaccine needed       Relevant Orders   Flu Vaccine QUAD High Dose(Fluad) (Completed)      Follow-up: Return in about 6 months (around 02/06/2021) for CPE (fasting).  Wilfred Lacy, NP

## 2020-08-09 NOTE — Assessment & Plan Note (Signed)
Needs repeat lipid panel and CMP Maintain current crestor dose while waiting for lab results

## 2020-08-12 ENCOUNTER — Other Ambulatory Visit: Payer: Self-pay

## 2020-08-12 ENCOUNTER — Other Ambulatory Visit (INDEPENDENT_AMBULATORY_CARE_PROVIDER_SITE_OTHER): Payer: PRIVATE HEALTH INSURANCE

## 2020-08-12 DIAGNOSIS — E785 Hyperlipidemia, unspecified: Secondary | ICD-10-CM | POA: Diagnosis not present

## 2020-08-12 LAB — LIPID PANEL
Cholesterol: 154 mg/dL (ref 0–200)
HDL: 63 mg/dL (ref >39.00)
LDL Cholesterol: 75 mg/dL (ref 0–99)
NonHDL: 91.1
Total CHOL/HDL Ratio: 2
Triglycerides: 82 mg/dL (ref 0.0–149.0)
VLDL: 16.4 mg/dL (ref 0.0–40.0)

## 2020-08-12 LAB — COMPREHENSIVE METABOLIC PANEL
ALT: 15 U/L (ref 0–35)
AST: 20 U/L (ref 0–37)
Albumin: 4.3 g/dL (ref 3.5–5.2)
Alkaline Phosphatase: 65 U/L (ref 39–117)
BUN: 18 mg/dL (ref 6–23)
CO2: 30 mEq/L (ref 19–32)
Calcium: 9.5 mg/dL (ref 8.4–10.5)
Chloride: 105 mEq/L (ref 96–112)
Creatinine, Ser: 0.92 mg/dL (ref 0.40–1.20)
GFR: 60.89 mL/min (ref >60.00)
Glucose, Bld: 79 mg/dL (ref 70–99)
Potassium: 4.1 mEq/L (ref 3.5–5.1)
Sodium: 141 mEq/L (ref 135–145)
Total Bilirubin: 0.9 mg/dL (ref 0.2–1.2)
Total Protein: 6.6 g/dL (ref 6.0–8.3)

## 2020-08-20 ENCOUNTER — Ambulatory Visit: Payer: PRIVATE HEALTH INSURANCE | Admitting: Dermatology

## 2020-09-03 LAB — HM MAMMOGRAPHY

## 2020-10-16 ENCOUNTER — Ambulatory Visit: Payer: PRIVATE HEALTH INSURANCE | Admitting: Dermatology

## 2020-11-06 ENCOUNTER — Ambulatory Visit (INDEPENDENT_AMBULATORY_CARE_PROVIDER_SITE_OTHER): Payer: Medicare Other | Admitting: Dermatology

## 2020-11-06 ENCOUNTER — Other Ambulatory Visit: Payer: Self-pay

## 2020-11-06 ENCOUNTER — Encounter: Payer: Self-pay | Admitting: Dermatology

## 2020-11-06 DIAGNOSIS — L570 Actinic keratosis: Secondary | ICD-10-CM | POA: Diagnosis not present

## 2020-11-06 DIAGNOSIS — D485 Neoplasm of uncertain behavior of skin: Secondary | ICD-10-CM

## 2020-11-06 DIAGNOSIS — Z1283 Encounter for screening for malignant neoplasm of skin: Secondary | ICD-10-CM | POA: Diagnosis not present

## 2020-11-06 DIAGNOSIS — L821 Other seborrheic keratosis: Secondary | ICD-10-CM

## 2020-11-06 NOTE — Patient Instructions (Signed)

## 2020-11-09 ENCOUNTER — Encounter: Payer: Self-pay | Admitting: Dermatology

## 2020-11-09 NOTE — Progress Notes (Signed)
Follow-Up Visit   Subjective  Gwendolyn Sampson is a 69 y.o. female who presents for the following: Annual Exam (RIGHT NONHEALING X3 MONTHS).  New growths Location:  Duration:  Quality:  Associated Signs/Symptoms: Modifying Factors:  Severity:  Timing: Context:   Objective  Well appearing patient in no apparent distress; mood and affect are within normal limits. Objective  Chest - Medial [facility]): HISTORY BCC TX= MOHS LEFT EAR RIM STILL CLEAR PER DR Nonie Lochner.  Sun exposed areas plus upper chest on back, abdomen and legs examined.  No atypical moles or melanoma.  Objective  Left Malar Cheek: Flat 3 mm pink crust  Objective  Left Lower Back: Tan textured flat topped 5 mm papule  Objective  Right Flank: Pearly 6 mm crust, I SK versus SCCA.     Objective  Left Lower Back: 7 mm pink-tan crust, favor I SK       A focused examination was performed including Head, neck, back, upper chest, abdomen, legs, arms.. Relevant physical exam findings are noted in the Assessment and Plan.   Assessment & Plan    Screening exam for skin cancer Chest - Medial [facility])  Annual skin examination, self examine her skin twice annually.  AK (actinic keratosis) Left Malar Cheek  Destruction of lesion - Left Malar Cheek Complexity: simple   Destruction method: cryotherapy   Informed consent: discussed and consent obtained   Timeout:  patient name, date of birth, surgical site, and procedure verified Lesion destroyed using liquid nitrogen: Yes   Cryotherapy cycles:  5 Outcome: patient tolerated procedure well with no complications    Seborrheic keratosis Left Lower Back  Leave if stable  Neoplasm of uncertain behavior of skin (2) Right Flank  Skin / nail biopsy Type of biopsy: tangential   Informed consent: discussed and consent obtained   Timeout: patient name, date of birth, surgical site, and procedure verified   Procedure prep:  Patient was prepped and draped in  usual sterile fashion (Non sterile) Prep type:  Chlorhexidine Anesthesia: the lesion was anesthetized in a standard fashion   Anesthetic:  1% lidocaine w/ epinephrine 1-100,000 local infiltration Instrument used: flexible razor blade   Outcome: patient tolerated procedure well   Post-procedure details: wound care instructions given    Specimen 1 - Surgical pathology Differential Diagnosis: SEB K  Check Margins: No  Left Lower Back  Skin / nail biopsy Type of biopsy: tangential   Informed consent: discussed and consent obtained   Timeout: patient name, date of birth, surgical site, and procedure verified   Procedure prep:  Patient was prepped and draped in usual sterile fashion (Non sterile) Prep type:  Chlorhexidine Anesthesia: the lesion was anesthetized in a standard fashion   Anesthetic:  1% lidocaine w/ epinephrine 1-100,000 local infiltration Instrument used: flexible razor blade   Outcome: patient tolerated procedure well   Post-procedure details: wound care instructions given    Destruction of lesion Complexity: simple   Destruction method: electrodesiccation and curettage   Informed consent: discussed and consent obtained   Timeout:  patient name, date of birth, surgical site, and procedure verified Anesthesia: the lesion was anesthetized in a standard fashion   Anesthetic:  1% lidocaine w/ epinephrine 1-100,000 local infiltration Curettage performed in three different directions: Yes   Curettage cycles:  3 Margin per side (cm):  0.1 Final wound size (cm):  1.3 Hemostasis achieved with:  aluminum chloride Outcome: patient tolerated procedure well with no complications   Post-procedure details: wound care instructions given  Specimen 2 - Surgical pathology Differential Diagnosis: SEB K  Check Margins: No      I, [name], MD, have reviewed all documentation for this visit.  The documentation on 11/09/20 for the exam, diagnosis, procedures, and orders  are all accurate and complete.

## 2021-01-06 ENCOUNTER — Telehealth: Payer: Self-pay | Admitting: Nurse Practitioner

## 2021-01-06 DIAGNOSIS — E785 Hyperlipidemia, unspecified: Secondary | ICD-10-CM

## 2021-01-07 NOTE — Telephone Encounter (Signed)
Patient states that she will call us back to schedule an appointment.

## 2021-02-05 ENCOUNTER — Encounter: Payer: Self-pay | Admitting: Nurse Practitioner

## 2021-02-05 ENCOUNTER — Other Ambulatory Visit: Payer: Self-pay

## 2021-02-05 ENCOUNTER — Ambulatory Visit (INDEPENDENT_AMBULATORY_CARE_PROVIDER_SITE_OTHER): Payer: Medicare Other | Admitting: Nurse Practitioner

## 2021-02-05 VITALS — BP 126/78 | HR 74 | Temp 97.9°F | Wt 202.0 lb

## 2021-02-05 DIAGNOSIS — Z23 Encounter for immunization: Secondary | ICD-10-CM

## 2021-02-05 DIAGNOSIS — Z6833 Body mass index (BMI) 33.0-33.9, adult: Secondary | ICD-10-CM | POA: Diagnosis not present

## 2021-02-05 DIAGNOSIS — E6609 Other obesity due to excess calories: Secondary | ICD-10-CM | POA: Diagnosis not present

## 2021-02-05 DIAGNOSIS — E782 Mixed hyperlipidemia: Secondary | ICD-10-CM

## 2021-02-05 DIAGNOSIS — E785 Hyperlipidemia, unspecified: Secondary | ICD-10-CM

## 2021-02-05 DIAGNOSIS — E049 Nontoxic goiter, unspecified: Secondary | ICD-10-CM | POA: Insufficient documentation

## 2021-02-05 LAB — COMPREHENSIVE METABOLIC PANEL
ALT: 15 U/L (ref 0–35)
AST: 19 U/L (ref 0–37)
Albumin: 4.2 g/dL (ref 3.5–5.2)
Alkaline Phosphatase: 61 U/L (ref 39–117)
BUN: 17 mg/dL (ref 6–23)
CO2: 31 mEq/L (ref 19–32)
Calcium: 10 mg/dL (ref 8.4–10.5)
Chloride: 106 mEq/L (ref 96–112)
Creatinine, Ser: 0.83 mg/dL (ref 0.40–1.20)
GFR: 72.92 mL/min (ref >60.00)
Glucose, Bld: 85 mg/dL (ref 70–99)
Potassium: 4.7 mEq/L (ref 3.5–5.1)
Sodium: 140 mEq/L (ref 135–145)
Total Bilirubin: 1 mg/dL (ref 0.2–1.2)
Total Protein: 6.9 g/dL (ref 6.0–8.3)

## 2021-02-05 LAB — LIPID PANEL
Cholesterol: 151 mg/dL (ref 0–200)
HDL: 71.6 mg/dL (ref >39.00)
LDL Cholesterol: 63 mg/dL (ref 0–99)
NonHDL: 79.45
Total CHOL/HDL Ratio: 2
Triglycerides: 80 mg/dL (ref 0.0–149.0)
VLDL: 16 mg/dL (ref 0.0–40.0)

## 2021-02-05 MED ORDER — ROSUVASTATIN CALCIUM 10 MG PO TABS: 10.0000 mg | ORAL_TABLET | Freq: Every day | ORAL | 3 refills | Status: DC

## 2021-02-05 NOTE — Patient Instructions (Signed)
Go to lab for blood draw  Schedule appt with wellness coach.

## 2021-02-05 NOTE — Assessment & Plan Note (Signed)
LDL at goal, normal hepatic panel Lipid Panel     Component Value Date/Time   CHOL 151 02/05/2021 0957   TRIG 80.0 02/05/2021 0957   HDL 71.60 02/05/2021 0957   CHOLHDL 2 02/05/2021 0957   VLDL 16.0 02/05/2021 0957   LDLCALC 63 02/05/2021 0957   Continue crestor

## 2021-02-05 NOTE — Progress Notes (Signed)
Subjective:  Patient ID: Gwendolyn Sampson, female    DOB: 14-Jun-1951  Age: 70 y.o. MRN: [id number]  CC: Follow-up (Medication refill needed (Rosuvastatin). Pt is fasting)  HPI  Hyperlipidemia LDL at goal, normal hepatic panel Lipid Panel     Component Value Date/Time   CHOL 151 02/05/2021 0957   TRIG 80.0 02/05/2021 0957   HDL 71.60 02/05/2021 0957   CHOLHDL 2 02/05/2021 0957   VLDL 16.0 02/05/2021 0957   LDLCALC 63 02/05/2021 0957   Continue crestor  Wt Readings from Last 3 Encounters:  02/05/21 202 lb (91.6 kg)  08/09/20 199 lb 9.6 oz (90.5 kg)  05/31/19 200 lb (90.7 kg)   Reviewed past Medical, Social and Family history today.  Outpatient Medications Prior to Visit  Medication Sig Dispense Refill  . acetaminophen (TYLENOL) 325 MG tablet acetaminophen    . aspirin EC 81 MG tablet 81 mg. One every other day    . Biotin 1000 MCG CHEW Chew 1,000 mcg by mouth daily.    . Cholecalciferol (VITAMIN D-3) 25 MCG (1000 UT) CAPS Take by mouth.    . Docosahexaenoic Acid (DHA NATURAL OMEGA-3 PO) Take by mouth.    Javier Docker Oil (OMEGA-3) 500 MG CAPS Take 500 mg by mouth daily.    Marland Kitchen levothyroxine (SYNTHROID, LEVOTHROID) 75 MCG tablet levothyroxine 75 mcg tablet  TAKE 1 TABLET BY MOUTH ONCE DAILY FOR 90 DAYS    . multivitamin-lutein (OCUVITE-LUTEIN) CAPS capsule Take 1 capsule by mouth daily.    . Nutritional Supplements (JUICE PLUS FIBRE PO) Take by mouth.    . propranolol (INDERAL) 20 MG tablet propranolol 20 mg tablet  Take 1 tablet 3 times a day by oral route.    . propranolol ER (INDERAL LA) 60 MG 24 hr capsule Take 60 mg by mouth daily.    Marland Kitchen pyridoxine (B-6) 100 MG tablet Take 100 mg by mouth daily.    . Vitamin E 180 MG CAPS Take 180 mg by mouth daily.    . rosuvastatin (CRESTOR) 10 MG tablet TAKE ONE TABLET BY MOUTH ONE TIME DAILY 90 tablet 0   No facility-administered medications prior to visit.    ROS See HPI  Objective:  BP 126/78 (BP Location: Left Arm, Patient  Position: Sitting, Cuff Size: Normal)   Pulse 74   Temp 97.9 F (36.6 C) (Temporal)   Wt 202 lb (91.6 kg)   SpO2 98%   BMI 33.61 kg/m   Physical Exam Vitals reviewed.  Constitutional:      Appearance: She is obese.  Cardiovascular:     Rate and Rhythm: Normal rate and regular rhythm.     Pulses: Normal pulses.     Heart sounds: Normal heart sounds.  Pulmonary:     Effort: Pulmonary effort is normal.     Breath sounds: Normal breath sounds.  Musculoskeletal:     Right lower leg: No edema.     Left lower leg: No edema.  Neurological:     Mental Status: She is alert and oriented to person, place, and time.     Assessment & Plan:  This visit occurred during the SARS-CoV-2 public health emergency.  Safety protocols were in place, including screening questions prior to the visit, additional usage of staff PPE, and extensive cleaning of exam room while observing appropriate contact time as indicated for disinfecting solutions.   [name] was seen today for follow-up.  Diagnoses and all orders for this visit:  Mixed hyperlipidemia -  Lipid panel -     Comprehensive metabolic panel  Class 1 obesity due to excess calories with serious comorbidity and body mass index (BMI) of 33.0 to 33.9 in adult -     Comprehensive metabolic panel  Pneumococcal vaccine administered -     Pneumococcal polysaccharide vaccine 23-valent greater than or equal to 2yo subcutaneous/IM  Other orders -     Discontinue: rosuvastatin (CRESTOR) 10 MG tablet; Take 1 tablet (10 mg total) by mouth daily.    Problem List Items Addressed This Visit      Other   Hyperlipidemia - Primary    LDL at goal, normal hepatic panel Lipid Panel     Component Value Date/Time   CHOL 151 02/05/2021 0957   TRIG 80.0 02/05/2021 0957   HDL 71.60 02/05/2021 0957   CHOLHDL 2 02/05/2021 0957   VLDL 16.0 02/05/2021 0957   LDLCALC 63 02/05/2021 0957   Continue crestor      Relevant Orders   Lipid panel (Completed)    Comprehensive metabolic panel (Completed)    Other Visit Diagnoses    Class 1 obesity due to excess calories with serious comorbidity and body mass index (BMI) of 33.0 to 33.9 in adult       Relevant Orders   Comprehensive metabolic panel (Completed)   Pneumococcal vaccine administered       Relevant Orders   Pneumococcal polysaccharide vaccine 23-valent greater than or equal to 2yo subcutaneous/IM (Completed)      Follow-up: Return in about 6 months (around 08/08/2021) for hyperlipidemia (fasting).  Wilfred Lacy, NP

## 2021-02-06 NOTE — Telephone Encounter (Signed)
Pt came by and picked up script. verified it was the pt

## 2021-08-08 ENCOUNTER — Ambulatory Visit (INDEPENDENT_AMBULATORY_CARE_PROVIDER_SITE_OTHER): Payer: Medicare Other | Admitting: Nurse Practitioner

## 2021-08-08 ENCOUNTER — Encounter: Payer: Self-pay | Admitting: Nurse Practitioner

## 2021-08-08 ENCOUNTER — Other Ambulatory Visit: Payer: Self-pay

## 2021-08-08 VITALS — BP 102/76 | HR 85 | Temp 97.1°F | Wt 193.6 lb

## 2021-08-08 DIAGNOSIS — R002 Palpitations: Secondary | ICD-10-CM | POA: Insufficient documentation

## 2021-08-08 DIAGNOSIS — I700 Atherosclerosis of aorta: Secondary | ICD-10-CM

## 2021-08-08 DIAGNOSIS — E782 Mixed hyperlipidemia: Secondary | ICD-10-CM | POA: Diagnosis not present

## 2021-08-08 LAB — LIPID PANEL
Cholesterol: 122 mg/dL (ref 0–200)
HDL: 50.3 mg/dL (ref >39.00)
LDL Cholesterol: 59 mg/dL (ref 0–99)
NonHDL: 71.75
Total CHOL/HDL Ratio: 2
Triglycerides: 64 mg/dL (ref 0.0–149.0)
VLDL: 12.8 mg/dL (ref 0.0–40.0)

## 2021-08-08 NOTE — Progress Notes (Signed)
Subjective:  Patient ID: Gwendolyn Sampson, female    DOB: 06/07/1951  Age: 70 y.o. MRN: [id number]  CC: Follow-up (6 month f/u on cholesterol. /Pt is fasting. )  HPI  Aortic atherosclerosis ([place]) Per CXR completed by [facility]. No hx of acute MI. No tobacco use, no ETOH or illicit drug abuse, no Hx of HTN or DM or PAD. LDL at goal with crestor BP Readings from Last 3 Encounters:  08/08/21 102/76  02/05/21 126/78  08/09/20 110/80   Repeat lipid panel and hepatic panel Maintain crestor dose She requested for cardiology referral  BP Readings from Last 3 Encounters:  08/08/21 102/76  02/05/21 126/78  08/09/20 110/80    Wt Readings from Last 3 Encounters:  08/08/21 193 lb 9.6 oz (87.8 kg)  02/05/21 202 lb (91.6 kg)  08/09/20 199 lb 9.6 oz (90.5 kg)    Reviewed past Medical, Social and Family history today.  Outpatient Medications Prior to Visit  Medication Sig Dispense Refill   acetaminophen (TYLENOL) 325 MG tablet acetaminophen     aspirin EC 81 MG tablet 81 mg. One every other day     Biotin 1000 MCG CHEW Chew 1,000 mcg by mouth daily.     Cholecalciferol (VITAMIN D-3) 25 MCG (1000 UT) CAPS Take by mouth.     Docosahexaenoic Acid (DHA NATURAL OMEGA-3 PO) Take by mouth.     Krill Oil (OMEGA-3) 500 MG CAPS Take 500 mg by mouth daily.     levothyroxine (SYNTHROID, LEVOTHROID) 75 MCG tablet levothyroxine 75 mcg tablet  TAKE 1 TABLET BY MOUTH ONCE DAILY FOR 90 DAYS     multivitamin-lutein (OCUVITE-LUTEIN) CAPS capsule Take 1 capsule by mouth daily.     Nutritional Supplements (JUICE PLUS FIBRE PO) Take by mouth.     propranolol (INDERAL) 20 MG tablet propranolol 20 mg tablet  Take 1 tablet 3 times a day by oral route.     propranolol ER (INDERAL LA) 60 MG 24 hr capsule Take 60 mg by mouth daily.     pyridoxine (B-6) 100 MG tablet Take 100 mg by mouth daily.     rosuvastatin (CRESTOR) 10 MG tablet Take 1 tablet (10 mg total) by mouth daily. 90 tablet 3   Vitamin E 180 MG  CAPS Take 180 mg by mouth daily.     No facility-administered medications prior to visit.    ROS See HPI  Objective:  BP 102/76 (BP Location: Left Arm, Patient Position: Sitting, Cuff Size: Normal)   Pulse 85   Temp (!) 97.1 F (36.2 C) (Temporal)   Wt 193 lb 9.6 oz (87.8 kg)   SpO2 96%   BMI 32.22 kg/m   Physical Exam Vitals reviewed.  Constitutional:      Appearance: She is obese.  Cardiovascular:     Rate and Rhythm: Normal rate and regular rhythm.     Pulses: Normal pulses.     Heart sounds: Normal heart sounds.  Pulmonary:     Effort: Pulmonary effort is normal.     Breath sounds: Normal breath sounds.  Musculoskeletal:     Right lower leg: No edema.     Left lower leg: No edema.  Neurological:     Mental Status: She is alert and oriented to person, place, and time.  Psychiatric:        Mood and Affect: Mood normal.        Behavior: Behavior normal.    Assessment & Plan:  This visit occurred during the SARS-CoV-2  public health emergency.  Safety protocols were in place, including screening questions prior to the visit, additional usage of staff PPE, and extensive cleaning of exam room while observing appropriate contact time as indicated for disinfecting solutions.   Gwendolyn Sampson was seen today for follow-up.  Diagnoses and all orders for this visit:  Mixed hyperlipidemia -     Lipid panel -     Ambulatory referral to Cardiology  Aortic atherosclerosis [facility]) -     Ambulatory referral to Cardiology  Problem List Items Addressed This Visit       Cardiovascular and Mediastinum   Aortic atherosclerosis ([place])    Per CXR completed by [facility]. No hx of acute MI. No tobacco use, no ETOH or illicit drug abuse, no Hx of HTN or DM or PAD. LDL at goal with crestor BP Readings from Last 3 Encounters:  08/08/21 102/76  02/05/21 126/78  08/09/20 110/80   Repeat lipid panel and hepatic panel Maintain crestor dose She requested for cardiology referral        Relevant Orders   Ambulatory referral to Cardiology     Other   Hyperlipidemia - Primary   Relevant Orders   Lipid panel   Ambulatory referral to Cardiology    Follow-up: Return in about 6 months (around 02/05/2022) for AWV and annual check (fasting).  Gwendolyn Lacy, NP

## 2021-08-08 NOTE — Patient Instructions (Signed)
You will be contacted to schedule appt with cardiology  Go to lab for blood draw

## 2021-08-08 NOTE — Assessment & Plan Note (Addendum)
Per CXR completed by [facility]. No hx of acute MI. No tobacco use, no ETOH or illicit drug abuse, no Hx of HTN or DM or PAD. LDL at goal with crestor BP Readings from Last 3 Encounters:  08/08/21 102/76  02/05/21 126/78  08/09/20 110/80   Repeat lipid panel and hepatic panel Maintain crestor dose She requested for cardiology referral

## 2021-09-14 LAB — HM MAMMOGRAPHY

## 2021-09-17 ENCOUNTER — Other Ambulatory Visit: Payer: Self-pay

## 2021-09-17 ENCOUNTER — Ambulatory Visit (INDEPENDENT_AMBULATORY_CARE_PROVIDER_SITE_OTHER): Payer: Medicare Other | Admitting: Podiatry

## 2021-09-17 ENCOUNTER — Encounter: Payer: Self-pay | Admitting: Podiatry

## 2021-09-17 DIAGNOSIS — B351 Tinea unguium: Secondary | ICD-10-CM | POA: Diagnosis not present

## 2021-09-17 NOTE — Progress Notes (Signed)
Subjective:   Patient ID: Gwendolyn Sampson, female   DOB: 70 y.o.   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  Patient does not smoke likes to be active   Review of Systems  All other systems reviewed and are negative.      Objective:  Physical Exam Vitals and nursing note reviewed.  Constitutional:      Appearance: She is well-developed.  Pulmonary:     Effort: Pulmonary effort is normal.  Musculoskeletal:        General: Normal range of motion.  Skin:    General: Skin is warm.  Neurological:     Mental Status: She is alert.    Neurovascular status intact muscle strength adequate range of motion within normal limits with mild discoloration of the distal portions of the nailbeds bilateral but appears to be more due to pressure versus a true fungal infection.  Good digital perfusion well oriented     Assessment:  More of a trauma issue against the nailbed versus fungus with possible mild fungal infiltration     Plan:  H NP reviewed condition discussed treatment options do not recommend oral medications did discuss topical or laser but do not recommend this at the current time and we will just keep an eye on it and if it gets worse we will have to consider more aggressive treatment plan

## 2021-09-19 ENCOUNTER — Ambulatory Visit (INDEPENDENT_AMBULATORY_CARE_PROVIDER_SITE_OTHER): Payer: Medicare Other | Admitting: Cardiology

## 2021-09-19 ENCOUNTER — Other Ambulatory Visit: Payer: Self-pay

## 2021-09-19 ENCOUNTER — Encounter: Payer: Self-pay | Admitting: Cardiology

## 2021-09-19 VITALS — BP 120/80 | HR 66 | Ht 65.0 in | Wt 195.0 lb

## 2021-09-19 DIAGNOSIS — Z833 Family history of diabetes mellitus: Secondary | ICD-10-CM | POA: Diagnosis not present

## 2021-09-19 DIAGNOSIS — R1310 Dysphagia, unspecified: Secondary | ICD-10-CM

## 2021-09-19 DIAGNOSIS — R002 Palpitations: Secondary | ICD-10-CM

## 2021-09-19 DIAGNOSIS — E7489 Other specified disorders of carbohydrate metabolism: Secondary | ICD-10-CM

## 2021-09-19 DIAGNOSIS — E8881 Metabolic syndrome: Secondary | ICD-10-CM

## 2021-09-19 DIAGNOSIS — I700 Atherosclerosis of aorta: Secondary | ICD-10-CM

## 2021-09-19 NOTE — Progress Notes (Signed)
Cardiology Office Note:    Date:  09/19/2021   ID:  Klyn Kroening, DOB 1951-07-11, MRN [id number]  PCP:  Flossie Buffy, NP  Cardiologist:  Berniece Salines, DO  Electrophysiologist:  None   Referring MD: Flossie Buffy, NP   " I am here because it will make her have some atherosclerosis"  History of Present Illness:    Gwendolyn Sampson is a 70 y.o. female with a hx of hypertension, hypothyroidism, aortic atherosclerosis with CT of the chest reporting coronary calcification.  The patient tells me she has come to discuss her CT scan as well as understand her cardiovascular risk.  She does not have any symptoms of chest pain or shortness of breath.  But she tells me that she is having difficulty swallowing especially when she eats fast.  She notes that during this time she has to bear down significantly to be able to have the food go down to her stomach area.  The episodes are frequent.  She also tells me that she has intermittent rare sensation fluttering of her heart which does not last long but occurs at times.   Past Medical History:  Diagnosis Date   Anxiety    Basal cell carcinoma 04/24/2015   LEFT EAR RIM TX=MOHS   Benign essential tremor    on inderal   Esotropia    Hypothyroidism    Interstitial pulmonary disease c/w post ALI/covid19 pna 03/02/2019   Onset of symptoms  February 13 2019  - HRCT 03/09/19 1. No typical groundglass type peripheral infiltrates to suggest viral pneumonia 2. Peripheral opacities in all lobes of both lungs with an upper lobe predominance, presumed fibrosis given the history of interstitial lung disease. - Covid IgG   03/31/2019  Positive   - 04/19/2019   Walked RA  2 laps @  approx 211ft each @ very fast pace  stopped due to   MRSA (methicillin resistant Staphylococcus aureus) 2010   wrist, waist, buttock    Past Surgical History:  Procedure Laterality Date   EYE SURGERY     cataract   LIPOMA EXCISION     on bacl   STRABISMUS SURGERY Bilateral  10/22/2017   Procedure: REPAIR STRABISMUS BILATERAL;  Surgeon: Everitt Amber, MD;  Location: [facility];  Service: Ophthalmology;  Laterality: Bilateral;   TONSILLECTOMY      Current Medications: Current Meds  Medication Sig   acetaminophen (TYLENOL) 325 MG tablet acetaminophen   aspirin EC 81 MG tablet 81 mg. One every other day   Biotin 1000 MCG CHEW Chew 1,000 mcg by mouth daily.   Cholecalciferol (VITAMIN D-3) 25 MCG (1000 UT) CAPS Take by mouth.   Docosahexaenoic Acid (DHA NATURAL OMEGA-3 PO) Take by mouth.   Krill Oil (OMEGA-3) 500 MG CAPS Take 500 mg by mouth daily.   levothyroxine (SYNTHROID, LEVOTHROID) 75 MCG tablet levothyroxine 75 mcg tablet  TAKE 1 TABLET BY MOUTH ONCE DAILY FOR 90 DAYS   multivitamin-lutein (OCUVITE-LUTEIN) CAPS capsule Take 1 capsule by mouth daily.   Nutritional Supplements (JUICE PLUS FIBRE PO) Take by mouth.   propranolol ER (INDERAL LA) 60 MG 24 hr capsule Take 60 mg by mouth daily.   pyridoxine (B-6) 100 MG tablet Take 100 mg by mouth daily.   rosuvastatin (CRESTOR) 10 MG tablet Take 1 tablet (10 mg total) by mouth daily.   Vitamin E 180 MG CAPS Take 180 mg by mouth daily.     Allergies:   Other   Social History  Socioeconomic History   Marital status: Divorced    Spouse name: Not on file   Number of children: Not on file   Years of education: Not on file   Highest education level: Not on file  Occupational History   Not on file  Tobacco Use   Smoking status: Never   Smokeless tobacco: Never  Vaping Use   Vaping Use: Never used  Substance and Sexual Activity   Alcohol use: No   Drug use: No   Sexual activity: Not on file  Other Topics Concern   Not on file  Social History Narrative   Not on file   Social Determinants of Health   Financial Resource Strain: Not on file  Food Insecurity: Not on file  Transportation Needs: Not on file  Physical Activity: Not on file  Stress: Not on file  Social Connections: Not  on file     Family History: The patient's family history is not on file.  ROS:   Review of Systems  Constitution: Negative for decreased appetite, fever and weight gain.  HENT: Negative for congestion, ear discharge, hoarse voice and sore throat.   Eyes: Negative for discharge, redness, vision loss in right eye and visual halos.  Cardiovascular: Negative for chest pain, dyspnea on exertion, leg swelling, orthopnea and palpitations.  Respiratory: Negative for cough, hemoptysis, shortness of breath and snoring.   Endocrine: Negative for heat intolerance and polyphagia.  Hematologic/Lymphatic: Negative for bleeding problem. Does not bruise/bleed easily.  Skin: Negative for flushing, nail changes, rash and suspicious lesions.  Musculoskeletal: Negative for arthritis, joint pain, muscle cramps, myalgias, neck pain and stiffness.  Gastrointestinal: Negative for abdominal pain, bowel incontinence, diarrhea and excessive appetite.  Genitourinary: Negative for decreased libido, genital sores and incomplete emptying.  Neurological: Negative for brief paralysis, focal weakness, headaches and loss of balance.  Psychiatric/Behavioral: Negative for altered mental status, depression and suicidal ideas.  Allergic/Immunologic: Negative for HIV exposure and persistent infections.    EKGs/Labs/Other Studies Reviewed:    The following studies were reviewed today:   EKG:  The ekg ordered today demonstrates sinus rhythm, heart rate 67 bpm with no ST segment changes.  High-resolution CT done in April 2022 IMPRESSION:  1. No typical groundglass type peripheral infiltrates to suggest viral pneumonia  2. Peripheral opacities in all lobes of both lungs with an upper lobe predominance, presumed fibrosis given the history of interstitial lung disease. Strictly speaking, infectious infiltrates cannot be excluded without COMPARISON with old studies.  3. No adenopathy or effusion  4. Coronary and aortic ASVD    Electronically Signed by: Ruta Hinds Narrative  COMPARISON: None.  INDICATION: Interstitial pulmonary disease, unspecified (#)  TECHNIQUE:  CT CHEST HIGH RESOLUTION -  Contrast:  Radiation dose reduction was utilized (automated exposure control, mA or kV adjustment based on patient size, or iterative image reconstruction).   Exam date/time: 03/09/2019 12:54 PM   FINDINGS: No COMPARISON studies. There are no typical groundglass type peripheral infiltrates to suggest viral pneumonia. There are peripheral presumed fibrotic changes in all lobes of both lungs with an upper lobe predominance. Strictly speaking,  infectious infiltrates cannot be excluded without COMPARISON to old films. No adenopathy. Normal heart size. Coronary and aortic ASVD. No pleural or pericardial effusion. Negative upper abdomen. The trachea and mainstem bronchi are patent. Other Result Text  Acute Interface, Incoming Rad Results - 03/09/2019  3:18 PM EDT  COMPARISON: None.  INDICATION: Interstitial pulmonary disease, unspecified (#)  TECHNIQUE:  CT CHEST HIGH RESOLUTION -  Contrast:  Radiation dose reduction was utilized (automated exposure control, mA or kV adjustment based on patient size, or iterative image reconstruction).   Exam date/time: 03/09/2019 12:54 PM   FINDINGS: No COMPARISON studies. There are no typical groundglass type peripheral infiltrates to suggest viral pneumonia. There are peripheral presumed fibrotic changes in all lobes of both lungs with an upper lobe predominance. Strictly speaking,  infectious infiltrates cannot be excluded without COMPARISON to old films. No adenopathy. Normal heart size. Coronary and aortic ASVD. No pleural or pericardial effusion. Negative upper abdomen. The trachea and mainstem bronchi are patent.    IMPRESSION:  1. No typical groundglass type peripheral infiltrates to suggest viral pneumonia  2. Peripheral opacities in all lobes of both lungs with an upper lobe  predominance, presumed fibrosis given the history of interstitial lung disease. Strictly speaking, infectious infiltrates cannot be excluded without COMPARISON with old studies.  3. No adenopathy or effusion  4. Coronary and aortic ASVD    Recent Labs: 02/05/2021: ALT 15; BUN 17; Creatinine, Ser 0.83; Potassium 4.7; Sodium 140  Recent Lipid Panel    Component Value Date/Time   CHOL 122 08/08/2021 1016   TRIG 64.0 08/08/2021 1016   HDL 50.30 08/08/2021 1016   CHOLHDL 2 08/08/2021 1016   VLDL 12.8 08/08/2021 1016   LDLCALC 59 08/08/2021 1016    Physical Exam:    VS:  BP 120/80 (BP Location: Right Arm)   Pulse 66   Ht 5\' 5"  (1.651 m)   Wt 195 lb (88.5 kg)   SpO2 95%   BMI 32.45 kg/m     Wt Readings from Last 3 Encounters:  09/19/21 195 lb (88.5 kg)  08/08/21 193 lb 9.6 oz (87.8 kg)  02/05/21 202 lb (91.6 kg)     GEN: Well nourished, well developed in no acute distress HEENT: Normal NECK: No JVD; No carotid bruits LYMPHATICS: No lymphadenopathy CARDIAC: S1S2 noted,RRR, no murmurs, rubs, gallops RESPIRATORY:  Clear to auscultation without rales, wheezing or rhonchi  ABDOMEN: Soft, non-tender, non-distended, +bowel sounds, no guarding. EXTREMITIES: No edema, No cyanosis, no clubbing MUSCULOSKELETAL:  No deformity  SKIN: Warm and dry NEUROLOGIC:  Alert and oriented x 3, non-focal PSYCHIATRIC:  Normal affect, good insight  ASSESSMENT:    1. Dysphagia, unspecified type   2. Palpitations   3. Metabolic syndrome   4. Family history of diabetes mellitus   5. Other specified disorders of carbohydrate metabolism ([place])    6. Aortic atherosclerosis (HCC)    PLAN:     I review her CT scan and there is report of coronary calcification as well as aortic atherosclerosis.  Given her risk factors she is not a candidate for coronary calcium scoring.  I have educated patient about this test and she is agreeable to proceed with this.  Was also able to discuss cost with the patient for  this testing and she wants to proceed with this.  In restratification with her metabolic syndrome we will get a hemoglobin A1c to make sure diabetes is not playing a role.  Hyperlipidemia - continue with current statin medication.  The patient understands the need to lose weight with diet and exercise. We have discussed specific strategies for this.  For her difficulty swallowing suspected/dysphagia I am concerned and it would be beneficial for the patient to be evaluated by our GI colleagues.  I will make this referral.  The patient is in agreement with the above plan. The patient left the office in stable condition.  The patient will follow up in 1 year or sooner if needed.   Medication Adjustments/Labs and Tests Ordered: Current medicines are reviewed at length with the patient today.  Concerns regarding medicines are outlined above.  Orders Placed This Encounter  Procedures   CT CARDIAC SCORING (SELF PAY ONLY)   Hemoglobin A1c   Ambulatory referral to Gastroenterology   EKG 12-Lead   No orders of the defined types were placed in this encounter.   Patient Instructions  Medication Instructions:  Your physician recommends that you continue on your current medications as directed. Please refer to the Current Medication list given to you today.  *If you need a refill on your cardiac medications before your next appointment, please call your pharmacy*   Lab Work: Your physician recommends that you return for lab work in:  TODAY: HbgA1C If you have labs (blood work) drawn today and your tests are completely normal, you will receive your results only by: [place] (if you have [place]) OR A paper copy in the mail If you have any lab test that is abnormal or we need to change your treatment, we will call you to review the results.   Testing/Procedures: Dr. Harriet Masson has ordered a CT coronary calcium score. This test is done at [address]. This is $99 out of  pocket.   Coronary CalciumScan A coronary calcium scan is an imaging test used to look for deposits of calcium and other fatty materials (plaques) in the inner lining of the blood vessels of the heart (coronary arteries). These deposits of calcium and plaques can partly clog and narrow the coronary arteries without producing any symptoms or warning signs. This puts a person at risk for a heart attack. This test can detect these deposits before symptoms develop. Tell a health care provider about: Any allergies you have. All medicines you are taking, including vitamins, herbs, eye drops, creams, and over-the-counter medicines. Any problems you or family members have had with anesthetic medicines. Any blood disorders you have. Any surgeries you have had. Any medical conditions you have. Whether you are pregnant or may be pregnant. What are the risks? Generally, this is a safe procedure. However, problems may occur, including: Harm to a pregnant woman and her unborn baby. This test involves the use of radiation. Radiation exposure can be dangerous to a pregnant woman and her unborn baby. If you are pregnant, you generally should not have this procedure done. Slight increase in the risk of cancer. This is because of the radiation involved in the test. What happens before the procedure? No preparation is needed for this procedure. What happens during the procedure? You will undress and remove any jewelry around your neck or chest. You will put on a hospital gown. Sticky electrodes will be placed on your chest. The electrodes will be connected to an electrocardiogram (ECG) machine to record a tracing of the electrical activity of your heart. A CT scanner will take pictures of your heart. During this time, you will be asked to lie still and hold your breath for 2-3 seconds while a picture of your heart is being taken. The procedure may vary among health care providers and hospitals. What happens  after the procedure? You can get dressed. You can return to your normal activities. It is up to you to get the results of your test. Ask your health care provider, or the department that is doing the test, when your results will be ready. Summary A  coronary calcium scan is an imaging test used to look for deposits of calcium and other fatty materials (plaques) in the inner lining of the blood vessels of the heart (coronary arteries). Generally, this is a safe procedure. Tell your health care provider if you are pregnant or may be pregnant. No preparation is needed for this procedure. A CT scanner will take pictures of your heart. You can return to your normal activities after the scan is done. This information is not intended to replace advice given to you by your health care provider. Make sure you discuss any questions you have with your health care provider. Document Released: 05/07/2008 Document Revised: 09/28/2016 Document Reviewed: 09/28/2016 Elsevier Interactive Patient Education  2017 [place]: At [facility], you and your health needs are our priority.  As part of our continuing mission to provide you with exceptional heart care, we have created designated Provider Care Teams.  These Care Teams include your primary Cardiologist (physician) and Advanced Practice Providers (APPs -  Physician Assistants and Nurse Practitioners) who all work together to provide you with the care you need, when you need it.  We recommend signing up for the patient portal called "MyChart".  Sign up information is provided on this After Visit Summary.  MyChart is used to connect with patients for Virtual Visits (Telemedicine).  Patients are able to view lab/test results, encounter notes, upcoming appointments, etc.  Non-urgent messages can be sent to your provider as well.   To learn more about what you can do with MyChart, go to [email or web address].    Your next appointment:   1  year(s)  The format for your next appointment:   In Person  Provider:   Berniece Salines, DO [address], [place], [place] [address]    Other Instructions   Adopting a Healthy Lifestyle.  Know what a healthy weight is for you (roughly BMI <25) and aim to maintain this   Aim for 7+ servings of fruits and vegetables daily   65-80+ fluid ounces of water or unsweet tea for healthy kidneys   Limit to max 1 drink of alcohol per day; avoid smoking/tobacco   Limit animal fats in diet for cholesterol and heart health - choose grass fed whenever available   Avoid highly processed foods, and foods high in saturated/trans fats   Aim for low stress - take time to unwind and care for your mental health   Aim for 150 min of moderate intensity exercise weekly for heart health, and weights twice weekly for bone health   Aim for 7-9 hours of sleep daily   When it comes to diets, agreement about the perfect plan isnt easy to find, even among the experts. Experts at the [employer] developed an idea known as the Healthy Eating Plate. Just imagine a plate divided into logical, healthy portions.   The emphasis is on diet quality:   Load up on vegetables and fruits - one-half of your plate: Aim for color and variety, and remember that potatoes dont count.   Go for whole grains - one-quarter of your plate: Whole wheat, barley, wheat berries, quinoa, oats, brown rice, and foods made with them. If you want pasta, go with whole wheat pasta.   Protein power - one-quarter of your plate: Fish, chicken, beans, and nuts are all healthy, versatile protein sources. Limit red meat.   The diet, however, does go beyond the plate, offering a few other suggestions.  Use healthy plant oils, such as olive, canola, soy, corn, sunflower and peanut. Check the labels, and avoid partially hydrogenated oil, which have unhealthy trans fats.   If youre thirsty, drink water. Coffee and tea are  good in moderation, but skip sugary drinks and limit milk and dairy products to one or two daily servings.   The type of carbohydrate in the diet is more important than the amount. Some sources of carbohydrates, such as vegetables, fruits, whole grains, and beans-are healthier than others.   Finally, stay active  Signed, Berniece Salines, DO  09/19/2021 1:41 PM    [facility]

## 2021-09-19 NOTE — Patient Instructions (Signed)
Medication Instructions:  Your physician recommends that you continue on your current medications as directed. Please refer to the Current Medication list given to you today.  *If you need a refill on your cardiac medications before your next appointment, please call your pharmacy*   Lab Work: Your physician recommends that you return for lab work in:  TODAY: HbgA1C If you have labs (blood work) drawn today and your tests are completely normal, you will receive your results only by: [place] (if you have [place]) OR A paper copy in the mail If you have any lab test that is abnormal or we need to change your treatment, we will call you to review the results.   Testing/Procedures: Dr. Harriet Masson has ordered a CT coronary calcium score. This test is done at [address]. This is $99 out of pocket.   Coronary CalciumScan A coronary calcium scan is an imaging test used to look for deposits of calcium and other fatty materials (plaques) in the inner lining of the blood vessels of the heart (coronary arteries). These deposits of calcium and plaques can partly clog and narrow the coronary arteries without producing any symptoms or warning signs. This puts a person at risk for a heart attack. This test can detect these deposits before symptoms develop. Tell a health care provider about: Any allergies you have. All medicines you are taking, including vitamins, herbs, eye drops, creams, and over-the-counter medicines. Any problems you or family members have had with anesthetic medicines. Any blood disorders you have. Any surgeries you have had. Any medical conditions you have. Whether you are pregnant or may be pregnant. What are the risks? Generally, this is a safe procedure. However, problems may occur, including: Harm to a pregnant woman and her unborn baby. This test involves the use of radiation. Radiation exposure can be dangerous to a pregnant woman and her unborn baby.  If you are pregnant, you generally should not have this procedure done. Slight increase in the risk of cancer. This is because of the radiation involved in the test. What happens before the procedure? No preparation is needed for this procedure. What happens during the procedure? You will undress and remove any jewelry around your neck or chest. You will put on a hospital gown. Sticky electrodes will be placed on your chest. The electrodes will be connected to an electrocardiogram (ECG) machine to record a tracing of the electrical activity of your heart. A CT scanner will take pictures of your heart. During this time, you will be asked to lie still and hold your breath for 2-3 seconds while a picture of your heart is being taken. The procedure may vary among health care providers and hospitals. What happens after the procedure? You can get dressed. You can return to your normal activities. It is up to you to get the results of your test. Ask your health care provider, or the department that is doing the test, when your results will be ready. Summary A coronary calcium scan is an imaging test used to look for deposits of calcium and other fatty materials (plaques) in the inner lining of the blood vessels of the heart (coronary arteries). Generally, this is a safe procedure. Tell your health care provider if you are pregnant or may be pregnant. No preparation is needed for this procedure. A CT scanner will take pictures of your heart. You can return to your normal activities after the scan is done. This information is not intended  to replace advice given to you by your health care provider. Make sure you discuss any questions you have with your health care provider. Document Released: 05/07/2008 Document Revised: 09/28/2016 Document Reviewed: 09/28/2016 Elsevier Interactive Patient Education  2017 [place]: At [facility] you and your health needs are our priority.   As part of our continuing mission to provide you with exceptional heart care, we have created designated Provider Care Teams.  These Care Teams include your primary Cardiologist (physician) and Advanced Practice Providers (APPs -  Physician Assistants and Nurse Practitioners) who all work together to provide you with the care you need, when you need it.  We recommend signing up for the patient portal called "MyChart".  Sign up information is provided on this After Visit Summary.  MyChart is used to connect with patients for Virtual Visits (Telemedicine).  Patients are able to view lab/test results, encounter notes, upcoming appointments, etc.  Non-urgent messages can be sent to your provider as well.   To learn more about what you can do with MyChart, go to [email or web address].    Your next appointment:   1 year(s)  The format for your next appointment:   In Person  Provider:   Berniece Salines, DO [address], [place], [place] [address]    Other Instructions

## 2021-09-20 LAB — HEMOGLOBIN A1C
Est. average glucose Bld gHb Est-mCnc: 117 mg/dL
Hgb A1c MFr Bld: 5.7 % — ABNORMAL HIGH (ref 4.8–5.6)

## 2021-09-23 ENCOUNTER — Other Ambulatory Visit: Payer: Self-pay | Admitting: Cardiology

## 2021-09-23 ENCOUNTER — Other Ambulatory Visit: Payer: Self-pay

## 2021-09-23 DIAGNOSIS — I700 Atherosclerosis of aorta: Secondary | ICD-10-CM

## 2021-09-23 DIAGNOSIS — E785 Hyperlipidemia, unspecified: Secondary | ICD-10-CM

## 2021-09-23 DIAGNOSIS — E782 Mixed hyperlipidemia: Secondary | ICD-10-CM

## 2021-09-23 NOTE — Addendum Note (Signed)
Addended by: Orvan July on: 09/23/2021 01:05 PM   Modules accepted: Orders

## 2021-09-29 ENCOUNTER — Inpatient Hospital Stay: Admission: RE | Admit: 2021-09-29 | Payer: Self-pay | Source: Ambulatory Visit

## 2021-09-29 ENCOUNTER — Other Ambulatory Visit: Payer: Self-pay

## 2021-09-29 ENCOUNTER — Ambulatory Visit (INDEPENDENT_AMBULATORY_CARE_PROVIDER_SITE_OTHER): Payer: Medicare Other | Admitting: Gastroenterology

## 2021-09-29 ENCOUNTER — Encounter: Payer: Self-pay | Admitting: Gastroenterology

## 2021-09-29 ENCOUNTER — Ambulatory Visit (INDEPENDENT_AMBULATORY_CARE_PROVIDER_SITE_OTHER)
Admission: RE | Admit: 2021-09-29 | Discharge: 2021-09-29 | Disposition: A | Discharge status: Home / Self Care | Payer: Self-pay | Source: Ambulatory Visit | Attending: Cardiology | Admitting: Cardiology

## 2021-09-29 VITALS — BP 118/78 | HR 71 | Ht 65.0 in | Wt 192.0 lb

## 2021-09-29 DIAGNOSIS — I700 Atherosclerosis of aorta: Secondary | ICD-10-CM

## 2021-09-29 DIAGNOSIS — R1310 Dysphagia, unspecified: Secondary | ICD-10-CM

## 2021-09-29 DIAGNOSIS — Z6832 Body mass index (BMI) 32.0-32.9, adult: Secondary | ICD-10-CM

## 2021-09-29 DIAGNOSIS — E669 Obesity, unspecified: Secondary | ICD-10-CM

## 2021-09-29 DIAGNOSIS — Z86010 Personal history of colonic polyps: Secondary | ICD-10-CM | POA: Diagnosis not present

## 2021-09-29 NOTE — Patient Instructions (Addendum)
If you are age 68 or older, your body mass index should be between 23-30. Your Body mass index is 31.95 kg/m. If this is out of the aforementioned range listed, please consider follow up with your Primary Care Provider.  If you are age 70 or younger, your body mass index should be between 19-25. Your Body mass index is 31.95 kg/m. If this is out of the aformentioned range listed, please consider follow up with your Primary Care Provider.   ________________________________________________________  The [facility] providers would like to encourage you to use [facility] to communicate with providers for non-urgent requests or questions.  Due to long hold times on the telephone, sending your provider a message by [facility] may be a faster and more efficient way to get a response.  Please allow 48 business hours for a response.  Please remember that this is for non-urgent requests.  _______________________________________________________  It has been recommended to you by your physician that you have a(n) EGD completed. However, until you have a Cardiac evaluation we will not schedule the procedure. We will be in touch once you have been worked up by Cardiology to determine next steps.    You will be due for a recall colonoscopy in 08-2023. We will send you a reminder in the mail when it gets closer to that time.   Thank you for entrusting me with your care and for choosing [facility], Dr. Tamms Cellar

## 2021-09-29 NOTE — Progress Notes (Signed)
HPI :  70 year old female with a history of COVID in 2020, hypothyroid, hyperlipidemia, referred by Wilfred Lacy NP for dysphagia.  She relates that she has been having difficulty swallowing food if she eats fast.  She states that she eats quickly she will have food gets stuck at the bottom of her esophagus which is very uncomfortable.  She will have to drink liquids and try to "bear down" to push the food into her stomach which usually works.  She denies any history of food impaction.  If she takes her time and eats slowly this tends not to happen.  She has no problems with liquid foods at baseline.  She does have occasional heartburn that bothers her, does not really take too much for this other than TUMS as needed which works well.  Symptoms of reflux appear mild and intermittent.  No nausea or vomiting.  No abdominal pains.  No blood in her stools.  She has occasional constipation for which she takes an over-the-counter laxative as needed.  She does have some mild hemorrhoid symptoms.  She thinks this has been going on in general since she had COVID in 2020 although hard to say for sure when exactly it started.  She states after she had COVID she had an x-ray which led to a CT scan which showed she has some atherosclerosis.  She denies any baseline chest pain or shortness of breath but has occasional "flutters" of her heart periodically that seem mild and self-limited.  No history of syncope.  She has been seen by cardiology recently, Dr.Tobb, will be undergoing a cardiac CT for calcium score.  She denies any known family history of colon cancer, esophageal cancer, gastric cancer.  Her father had pancreatic cancer diagnosed in his 11s.  She had her last colonoscopy done with Dr. Collene Mares as outlined below in 2019.  She has never had a prior EGD.  Colonoscopy 08/2018 - Dr. Collene Mares - sigmoid diverticulosis, 23mm polyp removed from the cecum, small internal hemorrhoids. Path c/w sessile serrated  polyp   Past Medical History:  Diagnosis Date   Anxiety    Basal cell carcinoma 04/24/2015   LEFT EAR RIM TX=MOHS   Benign essential tremor    on inderal   Esotropia    Hypothyroidism    Interstitial pulmonary disease c/w post ALI/covid19 pna 03/02/2019   Onset of symptoms  February 13 2019  - HRCT 03/09/19 1. No typical groundglass type peripheral infiltrates to suggest viral pneumonia 2. Peripheral opacities in all lobes of both lungs with an upper lobe predominance, presumed fibrosis given the history of interstitial lung disease. - Covid IgG   03/31/2019  Positive   - 04/19/2019   Walked RA  2 laps @  approx 236ft each @ very fast pace  stopped due to   MRSA (methicillin resistant Staphylococcus aureus) 2010   wrist, waist, buttock     Past Surgical History:  Procedure Laterality Date   EYE SURGERY     cataract   LIPOMA EXCISION     on bacl   STRABISMUS SURGERY Bilateral 10/22/2017   Procedure: REPAIR STRABISMUS BILATERAL;  Surgeon: Everitt Amber, MD;  Location: [facility];  Service: Ophthalmology;  Laterality: Bilateral;   TONSILLECTOMY     History reviewed. No pertinent family history. Social History   Tobacco Use   Smoking status: Never   Smokeless tobacco: Never  Vaping Use   Vaping Use: Never used  Substance Use Topics   Alcohol use:  No   Drug use: No   Current Outpatient Medications  Medication Sig Dispense Refill   acetaminophen (TYLENOL) 325 MG tablet acetaminophen     aspirin EC 81 MG tablet 81 mg. One every other day     Biotin 1000 MCG CHEW Chew 1,000 mcg by mouth daily.     Cholecalciferol (VITAMIN D-3) 25 MCG (1000 UT) CAPS Take by mouth.     Docosahexaenoic Acid (DHA NATURAL OMEGA-3 PO) Take by mouth.     Krill Oil (OMEGA-3) 500 MG CAPS Take 500 mg by mouth daily.     levothyroxine (SYNTHROID, LEVOTHROID) 75 MCG tablet levothyroxine 75 mcg tablet  TAKE 1 TABLET BY MOUTH ONCE DAILY FOR 90 DAYS     multivitamin-lutein (OCUVITE-LUTEIN) CAPS  capsule Take 1 capsule by mouth daily.     Nutritional Supplements (JUICE PLUS FIBRE PO) Take by mouth.     propranolol ER (INDERAL LA) 60 MG 24 hr capsule Take 60 mg by mouth daily.     pyridoxine (B-6) 100 MG tablet Take 100 mg by mouth daily.     rosuvastatin (CRESTOR) 10 MG tablet Take 1 tablet (10 mg total) by mouth daily. 90 tablet 3   Vitamin E 180 MG CAPS Take 180 mg by mouth daily.     No current facility-administered medications for this visit.   Allergies  Allergen Reactions   Other Itching    Tree and shrubs.     Review of Systems: All systems reviewed and negative except where noted in HPI.    Lab Results  Component Value Date   WBC 7.8 03/08/2019   HGB 13.8 03/08/2019   HCT 40.8 03/08/2019   MCV 87.8 03/08/2019   PLT 440.0 (H) 03/08/2019    Lab Results  Component Value Date   CREATININE 0.83 02/05/2021   BUN 17 02/05/2021   NA 140 02/05/2021   K 4.7 02/05/2021   CL 106 02/05/2021   CO2 31 02/05/2021    Lab Results  Component Value Date   ALT 15 02/05/2021   AST 19 02/05/2021   ALKPHOS 61 02/05/2021   BILITOT 1.0 02/05/2021     Physical Exam: BP 118/78   Pulse 71   Ht 5\' 5"  (1.651 m)   Wt 192 lb (87.1 kg)   BMI 31.95 kg/m  Constitutional: Pleasant,well-developed, female in no acute distress. HEENT: Normocephalic and atraumatic. Conjunctivae are normal. No scleral icterus. Neck supple.  Cardiovascular: Normal rate, regular rhythm.  Pulmonary/chest: Effort normal and breath sounds normal.  Abdominal: Soft, nondistended, nontender. There are no masses palpable.  Extremities: no edema Lymphadenopathy: No cervical adenopathy noted. Neurological: Alert and oriented to person place and time. Skin: Skin is warm and dry. No rashes noted. Psychiatric: Normal mood and affect. Behavior is normal.   ASSESSMENT AND PLAN: 70 year old female here for new patient assessment of following:  Dysphagia History of colon polyps Aortic  atherosclerosis  Patient presenting with intermittent dysphagia as described above.  We discussed differential diagnosis, suspect she may have a Schatzki's ring but needs an EGD to rule out other pathology and to perform dilation if amenable.  I discussed EGD and anesthesia with her, associated risks benefits, she wishes to proceed.  Of note she has a cardiac CT that is being done to date with her cardiologist and to determine if she needs any further cardiac risk stratification.  We will await that to be done first and interpreted with cardiology recommendations prior to scheduling her.  If no further intervention  is needed on their own we will proceed with endoscopy in the near future.  If she needs further cardiac restratification she should have that done first prior to proceeding with her EGD.  She is agreeable with this.  Otherwise recommend she eat slowly and chew her food well to minimize symptoms.  She is due for repeat colonoscopy in October 2024, recall placed for her history of sessile serrated polyp.  She agrees with the plan, we will check on her later this week to determine when we will schedule her endoscopy.  Plan - EGD once she completes her cardiac workup - surveillance colonoscopy 08/2023  Jolly Mango, MD [facility]  CC: Lorayne Marek Charlene Brooke, NP

## 2021-09-29 NOTE — Progress Notes (Signed)
Referral made for pt.

## 2021-10-06 ENCOUNTER — Other Ambulatory Visit: Payer: Self-pay

## 2021-10-06 DIAGNOSIS — R1310 Dysphagia, unspecified: Secondary | ICD-10-CM

## 2021-10-06 DIAGNOSIS — R0789 Other chest pain: Secondary | ICD-10-CM

## 2021-10-06 NOTE — Progress Notes (Signed)
Order spaced for chest CTA for 6 months from now.

## 2021-10-07 NOTE — Telephone Encounter (Signed)
Called patient. Scheduled her for an EGD on 1-5. Patient refused an sooner appointment bc she had to have 1st thing in the morning since see can't go without food "that long". Patient advised that I am mailing her instructions and sending them to [place].

## 2021-10-07 NOTE — Telephone Encounter (Signed)
-----   Message from Yetta Flock, MD sent at 10/03/2021  1:02 PM EST ----- Regarding: RE: chart check - Cardiac clearance Hey Jan. This patient had the cardiac CT done and her cardiologist is not recommending any further cardiac risk stratification at this time. I think okay to book for EGD if you can help coordinate. Thanks!  ----- Message ----- From: Roetta Sessions, CMA Sent: 10/01/2021   8:23 AM EST To: Yetta Flock, MD Subject: FW: chart check - Cardiac clearance            This is your reminder to do a chart check for this patient seen on 11-7. She had CT Cardiac Scoring on 11-7   ----- Message ----- From: Roetta Sessions, CMA Sent: 10/01/2021  12:00 AM EST To: Roetta Sessions, CMA Subject: chart check - Cardiac clearance                Patient seen in the office 11-17 Needs EGD but needs cardiac clearance first- has a Cardiac CT scheduled for 11-7

## 2021-11-05 ENCOUNTER — Ambulatory Visit (INDEPENDENT_AMBULATORY_CARE_PROVIDER_SITE_OTHER): Payer: Medicare Other

## 2021-11-05 DIAGNOSIS — Z0000 Encounter for general adult medical examination without abnormal findings: Secondary | ICD-10-CM

## 2021-11-05 NOTE — Progress Notes (Signed)
Subjective:   Gwendolyn Sampson is a 70 y.o. female who presents for an Initial Medicare Annual Wellness Visit.  I connected with Gwendolyn Sampson today by telephone and verified that I am speaking with the correct person using two identifiers. Location patient: home Location provider: work Persons participating in the virtual visit: patient, provider.   I discussed the limitations, risks, security and privacy concerns of performing an evaluation and management service by telephone and the availability of in person appointments. I also discussed with the patient that there may be a patient responsible charge related to this service. The patient expressed understanding and verbally consented to this telephonic visit.    Interactive audio and video telecommunications were attempted between this provider and patient, however failed, due to patient having technical difficulties OR patient did not have access to video capability.  We continued and completed visit with audio only.    Review of Systems     Cardiac Risk Factors include: advanced age (>64men, >62 women);dyslipidemia     Objective:    Today's Vitals   There is no height or weight on file to calculate BMI.  Advanced Directives 11/05/2021 10/22/2017 10/20/2017  Does Patient Have a Medical Advance Directive? Yes Yes Yes  Type of Paramedic of [place];Living will Living will Living will  Does patient want to make changes to medical advance directive? - No - Patient declined -  Copy of [place] in Chart? No - copy requested - -    Current Medications (verified) Outpatient Encounter Medications as of 11/05/2021  Medication Sig   acetaminophen (TYLENOL) 325 MG tablet acetaminophen   aspirin EC 81 MG tablet Take 81 mg by mouth daily.   Biotin 1000 MCG CHEW Chew 1,000 mcg by mouth daily.   Cholecalciferol (VITAMIN D-3) 25 MCG (1000 UT) CAPS Take by mouth.   Docosahexaenoic Acid (DHA  NATURAL OMEGA-3 PO) Take by mouth.   Krill Oil (OMEGA-3) 500 MG CAPS Take 500 mg by mouth daily.   levothyroxine (SYNTHROID, LEVOTHROID) 75 MCG tablet levothyroxine 75 mcg tablet  TAKE 1 TABLET BY MOUTH ONCE DAILY FOR 90 DAYS   multivitamin-lutein (OCUVITE-LUTEIN) CAPS capsule Take 1 capsule by mouth daily.   Nutritional Supplements (JUICE PLUS FIBRE PO) Take by mouth.   propranolol ER (INDERAL LA) 60 MG 24 hr capsule Take 60 mg by mouth daily.   rosuvastatin (CRESTOR) 10 MG tablet Take 1 tablet (10 mg total) by mouth daily.   saccharomyces boulardii (FLORASTOR) 250 MG capsule Take 250 mg by mouth 2 (two) times daily.   Vitamin E 180 MG CAPS Take 180 mg by mouth daily.   pyridoxine (B-6) 100 MG tablet Take 100 mg by mouth daily. (Patient not taking: Reported on 11/05/2021)   No facility-administered encounter medications on file as of 11/05/2021.    Allergies (verified) Other   History: Past Medical History:  Diagnosis Date   Anxiety    Basal cell carcinoma 04/24/2015   LEFT EAR RIM TX=MOHS   Benign essential tremor    on inderal   Esotropia    Hypothyroidism    Interstitial pulmonary disease c/w post ALI/covid19 pna 03/02/2019   Onset of symptoms  February 13 2019  - HRCT 03/09/19 1. No typical groundglass type peripheral infiltrates to suggest viral pneumonia 2. Peripheral opacities in all lobes of both lungs with an upper lobe predominance, presumed fibrosis given the history of interstitial lung disease. - Covid IgG   03/31/2019  Positive   -  04/19/2019   Walked RA  2 laps @  approx 265ft each @ very fast pace  stopped due to   MRSA (methicillin resistant Staphylococcus aureus) 2010   wrist, waist, buttock   Past Surgical History:  Procedure Laterality Date   EYE SURGERY     cataract   LIPOMA EXCISION     on bacl   STRABISMUS SURGERY Bilateral 10/22/2017   Procedure: REPAIR STRABISMUS BILATERAL;  Surgeon: Everitt Amber, MD;  Location: [facility];  Service:  Ophthalmology;  Laterality: Bilateral;   TONSILLECTOMY     History reviewed. No pertinent family history. Social History   Socioeconomic History   Marital status: Divorced    Spouse name: Not on file   Number of children: Not on file   Years of education: Not on file   Highest education level: Not on file  Occupational History   Not on file  Tobacco Use   Smoking status: Never   Smokeless tobacco: Never  Vaping Use   Vaping Use: Never used  Substance and Sexual Activity   Alcohol use: No   Drug use: No   Sexual activity: Not on file  Other Topics Concern   Not on file  Social History Narrative   Not on file   Social Determinants of Health   Financial Resource Strain: Low Risk    Difficulty of Paying Living Expenses: Not hard at all  Food Insecurity: No Food Insecurity   Worried About Running Out of Food in the Last Year: Never true   [place] in the Last Year: Never true  Transportation Needs: No Transportation Needs   Lack of Transportation (Medical): No   Lack of Transportation (Non-Medical): No  Physical Activity: Sufficiently Active   Days of Exercise per Week: 5 days   Minutes of Exercise per Session: 30 min  Stress: No Stress Concern Present   Feeling of Stress : Not at all  Social Connections: Socially Isolated   Frequency of Communication with Friends and Family: Twice a week   Frequency of Social Gatherings with Friends and Family: Twice a week   Attends Religious Services: Never   Printmaker: No   Attends Music therapist: Never   Marital Status: Divorced    Tobacco Counseling Counseling given: Not Answered   Clinical Intake:  Pre-visit preparation completed: Yes  Pain : No/denies pain     Nutritional Risks: None Diabetes: No  How often do you need to have someone help you when you read instructions, pamphlets, or other written materials from your doctor or pharmacy?: 1 - Never What is the  last grade level you completed in school?: BA  Diabetic?no   Interpreter Needed?: No  Information entered by :: L.Ulices Maack,LPN   Activities of Daily Living In your present state of health, do you have any difficulty performing the following activities: 11/05/2021  Hearing? N  Vision? N  Difficulty concentrating or making decisions? N  Walking or climbing stairs? N  Dressing or bathing? N  Doing errands, shopping? N  Preparing Food and eating ? N  Using the Toilet? N  In the past six months, have you accidently leaked urine? N  Do you have problems with loss of bowel control? N  Managing your Medications? N  Managing your Finances? N  Housekeeping or managing your Housekeeping? N  Some recent data might be hidden    Patient Care Team: Nche, Charlene Brooke, NP as PCP - General (  Internal Medicine) Berniece Salines, DO as PCP - Cardiology (Cardiology)  Indicate any recent Medical Services you may have received from other than [facility] providers in the past year (date may be approximate).     Assessment:   This is a routine wellness examination for Gwendolyn Sampson.  Hearing/Vision screen Vision Screening - Comments:: Annual eye exams wear glasses   Dietary issues and exercise activities discussed: Current Exercise Habits: Home exercise routine, Type of exercise: walking, Time (Minutes): 30, Frequency (Times/Week): 5, Weekly Exercise (Minutes/Week): 150, Intensity: Mild, Exercise limited by: None identified   Goals Addressed   None    Depression Screen PHQ 2/9 Scores 11/05/2021 11/05/2021 08/08/2021  PHQ - 2 Score 0 0 0  PHQ- 9 Score - - 2    Fall Risk Fall Risk  11/05/2021  Falls in the past year? 0  Number falls in past yr: 0  Injury with Fall? 0  Follow up Falls evaluation completed    FALL RISK PREVENTION PERTAINING TO THE HOME:  Any stairs in or around the home? Yes  If so, are there any without handrails? No  Home free of loose throw rugs in walkways, pet beds, electrical  cords, etc? Yes  Adequate lighting in your home to reduce risk of falls? Yes   ASSISTIVE DEVICES UTILIZED TO PREVENT FALLS:  Life alert? No  Use of a cane, walker or w/c? No  Grab bars in the bathroom? Yes  Shower chair or bench in shower? Yes  Elevated toilet seat or a handicapped toilet? Yes   Cognitive Function:    Normal cognitive status assessed by direct observation by this Nurse Health Advisor. No abnormalities found.      Immunizations Immunization History  Administered Date(s) Administered   Fluad Quad(high Dose 65+) 08/15/2019, 08/09/2020   Influenza,inj,Quad PF,6+ Mos 08/23/2017   Influenza-Unspecified 09/22/2014, 08/23/2016, 08/24/2019, 08/13/2020, 08/05/2021   PFIZER(Purple Top)SARS-COV-2 Vaccination 01/21/2020, 02/05/2020, 09/23/2020, 08/05/2021   Pneumococcal Conjugate-13 03/24/2019   Pneumococcal Polysaccharide-23 02/05/2021   Tdap 03/21/2021   Zoster Recombinat (Shingrix) 03/24/2019, 08/17/2019    TDAP status: Up to date  Flu Vaccine status: Up to date  Pneumococcal vaccine status: Up to date  Covid-19 vaccine status: Completed vaccines  Qualifies for Shingles Vaccine? Yes   Zostavax completed Yes   Shingrix Completed?: Yes  Screening Tests Health Maintenance  Topic Date Due   Hepatitis C Screening  Never done   MAMMOGRAM  09/03/2021   COVID-19 Vaccine (5 - Booster) 09/30/2021   COLONOSCOPY (Pts 45-4yrs Insurance coverage will need to be confirmed)  09/20/2023   TETANUS/TDAP  03/22/2031   Pneumonia Vaccine 101+ Years old  Completed   INFLUENZA VACCINE  Completed   DEXA SCAN  Completed   Zoster Vaccines- Shingrix  Completed   HPV VACCINES  Aged Out    Health Maintenance  Health Maintenance Due  Topic Date Due   Hepatitis C Screening  Never done   MAMMOGRAM  09/03/2021   COVID-19 Vaccine (5 - Booster) 09/30/2021    Colorectal cancer screening: Type of screening: Colonoscopy. Completed 09/19/2018. Repeat every 5 years  Mammogram  status: Completed 09/10/2021. Repeat every year  Bone Density status: Completed 06/02/2016. Results reflect: Bone density results: OSTEOPENIA. Repeat every 5 years.  Lung Cancer Screening: (Low Dose CT Chest recommended if Age 8-80 years, 30 pack-year currently smoking OR have quit w/in 15years.) does not qualify.   Lung Cancer Screening Referral: n/a  Additional Screening:  Hepatitis C Screening: does qualify;   Vision Screening: Recommended annual ophthalmology  exams for early detection of glaucoma and other disorders of the eye. Is the patient up to date with their annual eye exam?  Yes  Who is the provider or what is the name of the office in which the patient attends annual eye exams? Dr.Henzel  If pt is not established with a provider, would they like to be referred to a provider to establish care? No .   Dental Screening: Recommended annual dental exams for proper oral hygiene  Community Resource Referral / Chronic Care Management: CRR required this visit?  No   CCM required this visit?  No      Plan:     I have personally reviewed and noted the following in the patients chart:   Medical and social history Use of alcohol, tobacco or illicit drugs  Current medications and supplements including opioid prescriptions. Patient is not currently taking opioid prescriptions. Functional ability and status Nutritional status Physical activity Advanced directives List of other physicians Hospitalizations, surgeries, and ER visits in previous 12 months Vitals Screenings to include cognitive, depression, and falls Referrals and appointments  In addition, I have reviewed and discussed with patient certain preventive protocols, quality metrics, and best practice recommendations. A written personalized care plan for preventive services as well as general preventive health recommendations were provided to patient.     Randel Pigg, LPN   [id number]   Nurse Notes: none

## 2021-11-05 NOTE — Patient Instructions (Signed)
Gwendolyn Sampson , Thank you for taking time to come for your Medicare Wellness Visit. I appreciate your ongoing commitment to your health goals. Please review the following plan we discussed and let me know if I can assist you in the future.   Screening recommendations/referrals: Colonoscopy: 09/19/2018 Mammogram: 09/10/2021 Bone Density: 06/02/2016 Recommended yearly ophthalmology/optometry visit for glaucoma screening and checkup Recommended yearly dental visit for hygiene and checkup  Vaccinations: Influenza vaccine: completed  Pneumococcal vaccine: completed  Tdap vaccine: 03/21/2021 Shingles vaccine: completed     Advanced directives: will provide copies   Conditions/risks identified: none   Next appointment: 12/23/2021 0830am  CPE Amado Coe    Preventive Care 53 Years and Older, Female Preventive care refers to lifestyle choices and visits with your health care provider that can promote health and wellness. What does preventive care include? A yearly physical exam. This is also called an annual well check. Dental exams once or twice a year. Routine eye exams. Ask your health care provider how often you should have your eyes checked. Personal lifestyle choices, including: Daily care of your teeth and gums. Regular physical activity. Eating a healthy diet. Avoiding tobacco and drug use. Limiting alcohol use. Practicing safe sex. Taking low-dose aspirin every day. Taking vitamin and mineral supplements as recommended by your health care provider. What happens during an annual well check? The services and screenings done by your health care provider during your annual well check will depend on your age, overall health, lifestyle risk factors, and family history of disease. Counseling  Your health care provider may ask you questions about your: Alcohol use. Tobacco use. Drug use. Emotional well-being. Home and relationship well-being. Sexual activity. Eating  habits. History of falls. Memory and ability to understand (cognition). Work and work Statistician. Reproductive health. Screening  You may have the following tests or measurements: Height, weight, and BMI. Blood pressure. Lipid and cholesterol levels. These may be checked every 5 years, or more frequently if you are over 31 years old. Skin check. Lung cancer screening. You may have this screening every year starting at age 63 if you have a 30-pack-year history of smoking and currently smoke or have quit within the past 15 years. Fecal occult blood test (FOBT) of the stool. You may have this test every year starting at age 72. Flexible sigmoidoscopy or colonoscopy. You may have a sigmoidoscopy every 5 years or a colonoscopy every 10 years starting at age 30. Hepatitis C blood test. Hepatitis B blood test. Sexually transmitted disease (STD) testing. Diabetes screening. This is done by checking your blood sugar (glucose) after you have not eaten for a while (fasting). You may have this done every 1-3 years. Bone density scan. This is done to screen for osteoporosis. You may have this done starting at age 34. Mammogram. This may be done every 1-2 years. Talk to your health care provider about how often you should have regular mammograms. Talk with your health care provider about your test results, treatment options, and if necessary, the need for more tests. Vaccines  Your health care provider may recommend certain vaccines, such as: Influenza vaccine. This is recommended every year. Tetanus, diphtheria, and acellular pertussis (Tdap, Td) vaccine. You may need a Td booster every 10 years. Zoster vaccine. You may need this after age 31. Pneumococcal 13-valent conjugate (PCV13) vaccine. One dose is recommended after age 24. Pneumococcal polysaccharide (PPSV23) vaccine. One dose is recommended after age 38. Talk to your health care provider about which screenings and  vaccines you need and how  often you need them. This information is not intended to replace advice given to you by your health care provider. Make sure you discuss any questions you have with your health care provider. Document Released: 12/06/2015 Document Revised: 07/29/2016 Document Reviewed: 09/10/2015 Elsevier Interactive Patient Education  2017 [place] Prevention in the Home Falls can cause injuries. They can happen to people of all ages. There are many things you can do to make your home safe and to help prevent falls. What can I do on the outside of my home? Regularly fix the edges of walkways and driveways and fix any cracks. Remove anything that might make you trip as you walk through a door, such as a raised step or threshold. Trim any bushes or trees on the path to your home. Use bright outdoor lighting. Clear any walking paths of anything that might make someone trip, such as rocks or tools. Regularly check to see if handrails are loose or broken. Make sure that both sides of any steps have handrails. Any raised decks and porches should have guardrails on the edges. Have any leaves, snow, or ice cleared regularly. Use sand or salt on walking paths during winter. Clean up any spills in your garage right away. This includes oil or grease spills. What can I do in the bathroom? Use night lights. Install grab bars by the toilet and in the tub and shower. Do not use towel bars as grab bars. Use non-skid mats or decals in the tub or shower. If you need to sit down in the shower, use a plastic, non-slip stool. Keep the floor dry. Clean up any water that spills on the floor as soon as it happens. Remove soap buildup in the tub or shower regularly. Attach bath mats securely with double-sided non-slip rug tape. Do not have throw rugs and other things on the floor that can make you trip. What can I do in the bedroom? Use night lights. Make sure that you have a light by your bed that is easy to  reach. Do not use any sheets or blankets that are too big for your bed. They should not hang down onto the floor. Have a firm chair that has side arms. You can use this for support while you get dressed. Do not have throw rugs and other things on the floor that can make you trip. What can I do in the kitchen? Clean up any spills right away. Avoid walking on wet floors. Keep items that you use a lot in easy-to-reach places. If you need to reach something above you, use a strong step stool that has a grab bar. Keep electrical cords out of the way. Do not use floor polish or wax that makes floors slippery. If you must use wax, use non-skid floor wax. Do not have throw rugs and other things on the floor that can make you trip. What can I do with my stairs? Do not leave any items on the stairs. Make sure that there are handrails on both sides of the stairs and use them. Fix handrails that are broken or loose. Make sure that handrails are as long as the stairways. Check any carpeting to make sure that it is firmly attached to the stairs. Fix any carpet that is loose or worn. Avoid having throw rugs at the top or bottom of the stairs. If you do have throw rugs, attach them to the floor with carpet tape. Make  sure that you have a light switch at the top of the stairs and the bottom of the stairs. If you do not have them, ask someone to add them for you. What else can I do to help prevent falls? Wear shoes that: Do not have high heels. Have rubber bottoms. Are comfortable and fit you well. Are closed at the toe. Do not wear sandals. If you use a stepladder: Make sure that it is fully opened. Do not climb a closed stepladder. Make sure that both sides of the stepladder are locked into place. Ask someone to hold it for you, if possible. Clearly mark and make sure that you can see: Any grab bars or handrails. First and last steps. Where the edge of each step is. Use tools that help you move  around (mobility aids) if they are needed. These include: Canes. Walkers. Scooters. Crutches. Turn on the lights when you go into a dark area. Replace any light bulbs as soon as they burn out. Set up your furniture so you have a clear path. Avoid moving your furniture around. If any of your floors are uneven, fix them. If there are any pets around you, be aware of where they are. Review your medicines with your doctor. Some medicines can make you feel dizzy. This can increase your chance of falling. Ask your doctor what other things that you can do to help prevent falls. This information is not intended to replace advice given to you by your health care provider. Make sure you discuss any questions you have with your health care provider. Document Released: 09/05/2009 Document Revised: 04/16/2016 Document Reviewed: 12/14/2014 Elsevier Interactive Patient Education  2017 [employer].

## 2021-11-10 ENCOUNTER — Other Ambulatory Visit: Payer: Self-pay

## 2021-11-10 ENCOUNTER — Ambulatory Visit (INDEPENDENT_AMBULATORY_CARE_PROVIDER_SITE_OTHER): Payer: Medicare Other | Admitting: Dermatology

## 2021-11-10 DIAGNOSIS — Z1283 Encounter for screening for malignant neoplasm of skin: Secondary | ICD-10-CM | POA: Diagnosis not present

## 2021-11-10 DIAGNOSIS — D1801 Hemangioma of skin and subcutaneous tissue: Secondary | ICD-10-CM

## 2021-11-10 DIAGNOSIS — L578 Other skin changes due to chronic exposure to nonionizing radiation: Secondary | ICD-10-CM

## 2021-11-10 DIAGNOSIS — L821 Other seborrheic keratosis: Secondary | ICD-10-CM

## 2021-11-10 DIAGNOSIS — L720 Epidermal cyst: Secondary | ICD-10-CM

## 2021-11-10 DIAGNOSIS — Z85828 Personal history of other malignant neoplasm of skin: Secondary | ICD-10-CM

## 2021-11-27 ENCOUNTER — Other Ambulatory Visit: Payer: Self-pay

## 2021-11-27 ENCOUNTER — Ambulatory Visit (AMBULATORY_SURGERY_CENTER): Payer: Medicare Other | Admitting: Gastroenterology

## 2021-11-27 ENCOUNTER — Encounter: Payer: Self-pay | Admitting: Gastroenterology

## 2021-11-27 VITALS — BP 100/68 | HR 66 | Temp 98.4°F | Resp 22 | Ht 65.0 in | Wt 192.0 lb

## 2021-11-27 DIAGNOSIS — K222 Esophageal obstruction: Secondary | ICD-10-CM

## 2021-11-27 DIAGNOSIS — K449 Diaphragmatic hernia without obstruction or gangrene: Secondary | ICD-10-CM

## 2021-11-27 DIAGNOSIS — K2100 Gastro-esophageal reflux disease with esophagitis, without bleeding: Secondary | ICD-10-CM | POA: Diagnosis not present

## 2021-11-27 DIAGNOSIS — K2950 Unspecified chronic gastritis without bleeding: Secondary | ICD-10-CM | POA: Diagnosis not present

## 2021-11-27 DIAGNOSIS — R1310 Dysphagia, unspecified: Secondary | ICD-10-CM | POA: Diagnosis not present

## 2021-11-27 DIAGNOSIS — K2289 Other specified disease of esophagus: Secondary | ICD-10-CM | POA: Diagnosis not present

## 2021-11-27 DIAGNOSIS — K2960 Other gastritis without bleeding: Secondary | ICD-10-CM | POA: Diagnosis not present

## 2021-11-27 MED ORDER — SODIUM CHLORIDE 0.9 % IV SOLN: 500.0000 mL | Freq: Once | INTRAVENOUS | Status: DC

## 2021-11-27 MED ORDER — OMEPRAZOLE 20 MG PO CPDR: 20.0000 mg | DELAYED_RELEASE_CAPSULE | Freq: Every day | ORAL | 1 refills | Status: DC

## 2021-11-27 NOTE — Op Note (Signed)
[facility] Patient Name: Gwendolyn Sampson Procedure Date: 11/27/2021 7:58 AM MRN: [id number] Endoscopist: Remo Lipps P. Havery Moros , MD Age: 71 Referring MD:  Date of Birth: [birth year] Gender: Female Account #: [id number] Procedure:                Upper GI endoscopy Indications:              Dysphagia Medicines:                Monitored Anesthesia Care Procedure:                Pre-Anesthesia Assessment:                           - Prior to the procedure, a History and Physical                            was performed, and patient medications and                            allergies were reviewed. The patient's tolerance of                            previous anesthesia was also reviewed. The risks                            and benefits of the procedure and the sedation                            options and risks were discussed with the patient.                            All questions were answered, and informed consent                            was obtained. Prior Anticoagulants: The patient has                            taken no previous anticoagulant or antiplatelet                            agents. ASA Grade Assessment: II - A patient with                            mild systemic disease. After reviewing the risks                            and benefits, the patient was deemed in                            satisfactory condition to undergo the procedure.                           After obtaining informed consent, the endoscope was  passed under direct vision. Throughout the                            procedure, the patient's blood pressure, pulse, and                            oxygen saturations were monitored continuously. The                            GIF [id number] was introduced through the                            mouth, and advanced to the second part of duodenum.                            The upper GI endoscopy was accomplished  without                            difficulty. The patient tolerated the procedure                            well. Scope In: Scope Out: Findings:                 Esophagogastric landmarks were identified: the                            Z-line was found at 38 cm, the gastroesophageal                            junction was found at 38 cm and the upper extent of                            the gastric folds was found at 40 cm from the                            incisors.                           A 2 cm hiatal hernia was present.                           One benign-appearing, intrinsic mild / subtle                            stenosis was found at the GEJ. This stenosis                            measured less than one cm (in length). A TTS                            dilator was passed through the scope. Dilation with  an 18-19-20 mm balloon dilator was performed to 18                            mm, 19 mm and then 20 mm. No mucosal wrents were                            appreciated, very mild / subtle stenosis.                           LA Grade A / very mild esophagitis was found 38 cm                            from the incisors.                           The examined esophagus was rather tortuous.                           The exam of the esophagus was otherwise normal.                           Biopsies were taken with a cold forceps in the                            upper third of the esophagus, in the middle third                            of the esophagus and in the lower third of the                            esophagus for histology.                           Patchy mild inflammation characterized by erythema                            and granularity was found in the gastric body and                            in the gastric antrum. Biopsies were taken with a                            cold forceps for Helicobacter pylori testing.                            The exam of the stomach was otherwise normal.                           The duodenal bulb and second portion of the                            duodenum were normal. Complications:            No  immediate complications. Estimated blood loss:                            Minimal. Estimated Blood Loss:     Estimated blood loss was minimal. Impression:               - Esophagogastric landmarks identified.                           - 2 cm hiatal hernia.                           - Benign-appearing esophageal stenosis. Dilated to                            52mm.                           - LA Grade A reflux esophagitis.                           - Tortuous esophagus.                           - Normal esophagus otherwise - biopsies taken to                            rule out EoE.                           - Gastritis. Biopsied.                           - Normal duodenal bulb and second portion of the                            duodenum.                           Unclear if very mild stenosis in the setting of                            hiatal hernia / mild esophagitis is causing                            dysphagia, will also rule out EoE. Motility                            disorder also possible, will further workup if                            symptoms persist. Recommendation:           - Patient has a contact number available for                            emergencies. The signs and symptoms of potential  delayed complications were discussed with the                            patient. Return to normal activities tomorrow.                            Written discharge instructions were provided to the                            patient.                           - Resume previous diet.                           - Continue present medications.                           - Await pathology results.                           - Start omeprazole 20mg  / day for  esophagitis /                            gastritis                           - Await course post dilation and trial of PPI. If                            symptoms persist consider workup for motility                            disorder [name]. Havery Moros, MD 11/27/2021 8:26:48 AM This report has been signed electronically.

## 2021-11-27 NOTE — Progress Notes (Signed)
Report to PACU, RN, vss, BBS= Clear.  

## 2021-11-27 NOTE — Patient Instructions (Addendum)
Handouts Provided:  Esophagitis and Hiatal Hernia  Start Omeprazole 20 mg once daily  YOU HAD AN ENDOSCOPIC PROCEDURE TODAY AT [facility]:   Refer to the procedure report that was given to you for any specific questions about what was found during the examination.  If the procedure report does not answer your questions, please call your gastroenterologist to clarify.  If you requested that your care partner not be given the details of your procedure findings, then the procedure report has been included in a sealed envelope for you to review at your convenience later.  YOU SHOULD EXPECT: Some feelings of bloating in the abdomen. Passage of more gas than usual.  Walking can help get rid of the air that was put into your GI tract during the procedure and reduce the bloating. If you had a lower endoscopy (such as a colonoscopy or flexible sigmoidoscopy) you may notice spotting of blood in your stool or on the toilet paper. If you underwent a bowel prep for your procedure, you may not have a normal bowel movement for a few days.  Please Note:  You might notice some irritation and congestion in your nose or some drainage.  This is from the oxygen used during your procedure.  There is no need for concern and it should clear up in a day or so.  SYMPTOMS TO REPORT IMMEDIATELY:  Following upper endoscopy (EGD)  Vomiting of blood or coffee ground material  New chest pain or pain under the shoulder blades  Painful or persistently difficult swallowing  New shortness of breath  Fever of 100F or higher  Black, tarry-looking stools  For urgent or emergent issues, a gastroenterologist can be reached at any hour by calling [phone]. Do not use MyChart messaging for urgent concerns.    DIET:  We do recommend a small meal at first, but then you may proceed to your regular diet.  Drink plenty of fluids but you should avoid alcoholic beverages for 24 hours.  ACTIVITY:  You should plan to  take it easy for the rest of today and you should NOT DRIVE or use heavy machinery until tomorrow (because of the sedation medicines used during the test).    FOLLOW UP: Our staff will call the number listed on your records 48-72 hours following your procedure to check on you and address any questions or concerns that you may have regarding the information given to you following your procedure. If we do not reach you, we will leave a message.  We will attempt to reach you two times.  During this call, we will ask if you have developed any symptoms of COVID 19. If you develop any symptoms (ie: fever, flu-like symptoms, shortness of breath, cough etc.) before then, please call [phone].  If you test positive for Covid 19 in the 2 weeks post procedure, please call and report this information to [place].    If any biopsies were taken you will be contacted by phone or by letter within the next 1-3 weeks.  Please call us at [phone] if you have not heard about the biopsies in 3 weeks.    SIGNATURES/CONFIDENTIALITY: You and/or your care partner have signed paperwork which will be entered into your electronic medical record.  These signatures attest to the fact that that the information above on your After Visit Summary has been reviewed and is understood.  Full responsibility of the confidentiality of this discharge information lies with you and/or your care-partner.

## 2021-11-27 NOTE — Progress Notes (Signed)
Called to room to assist during endoscopic procedure.  Patient ID and intended procedure confirmed with present staff. Received instructions for my participation in the procedure from the performing physician.  

## 2021-11-27 NOTE — Progress Notes (Signed)
[facility] History and Physical   Primary Care Physician:  Nche, Charlene Brooke, NP   Reason for Procedure:   Dysphagia  Plan:    EGD     HPI: Gwendolyn Sampson is a 71 y.o. female  here for EGD to evaluate dysphagia - seen on 11/7. Has had cardiac workup by cardiology since last visit, placed on aspirin. Patient feels well without any cardiopulmonary symptoms. Otherwise dysphagia persists, no interval changes since I have last seen her. I have discussed risks / benefits of endoscopy and anesthesia and she wishes to proceed.   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 No typical groundglass type peripheral infiltrates to suggest viral pneumonia 2. Peripheral opacities in all lobes of both lungs with an upper lobe predominance, presumed fibrosis given the history of interstitial lung disease. - Covid IgG   03/31/2019  Positive   - 04/19/2019   Walked RA  2 laps @  approx 222ft each @ very fast pace  stopped due to   MRSA (methicillin resistant Staphylococcus aureus) 2010   wrist, waist, buttock   Neuromuscular disorder ([place])     Past Surgical History:  Procedure Laterality Date   EYE SURGERY     cataract   LIPOMA EXCISION     on bacl   STRABISMUS SURGERY Bilateral 10/22/2017   Procedure: REPAIR STRABISMUS BILATERAL;  Surgeon: Everitt Amber, MD;  Location: [facility];  Service: Ophthalmology;  Laterality: Bilateral;   TONSILLECTOMY      Prior to Admission medications   Medication Sig Start Date End Date Taking? Authorizing Provider  acetaminophen (TYLENOL) 325 MG tablet acetaminophen   Yes [provider]  aspirin EC 81 MG tablet Take 81 mg by mouth daily.   Yes [provider]  Biotin 1000 MCG CHEW Chew 1,000 mcg by mouth daily.   Yes [provider]  Cholecalciferol (VITAMIN D-3) 25 MCG (1000 UT) CAPS Take by mouth.   Yes [provider]  Docosahexaenoic Acid (DHA NATURAL OMEGA-3 PO) Take by mouth.   Yes [provider]  Javier Docker Oil (OMEGA-3) 500 MG CAPS Take 500 mg by mouth daily.   Yes [provider]  levothyroxine (SYNTHROID, LEVOTHROID) 75 MCG tablet levothyroxine 75 mcg tablet  TAKE 1 TABLET BY MOUTH ONCE DAILY FOR 90 DAYS   Yes [provider]  multivitamin-lutein (OCUVITE-LUTEIN) CAPS capsule Take 1 capsule by mouth daily.   Yes [provider]  Nutritional Supplements (JUICE PLUS FIBRE PO) Take by mouth.   Yes [provider]  propranolol ER (INDERAL LA) 60 MG 24 hr capsule Take 1 capsule by mouth daily. 11/18/21  Yes [provider]  pyridoxine (B-6) 100 MG tablet Take 100 mg by mouth daily.   Yes [provider]  rosuvastatin (CRESTOR) 10 MG tablet Take 1 tablet (10 mg total) by mouth daily. 02/05/21  Yes Nche, Charlene Brooke, NP  saccharomyces boulardii (FLORASTOR) 250 MG capsule Take 250 mg by mouth 2 (two) times daily.   Yes [provider]  Vitamin E 180 MG  CAPS Take 180 mg by mouth daily.   Yes [provider]    Current Outpatient Medications  Medication Sig Dispense Refill   acetaminophen (TYLENOL) 325 MG tablet acetaminophen     aspirin EC 81 MG tablet Take 81 mg by mouth daily.     Biotin 1000 MCG CHEW Chew 1,000 mcg by mouth daily.     Cholecalciferol (VITAMIN D-3) 25 MCG (1000 UT) CAPS Take by mouth.     Docosahexaenoic Acid (DHA NATURAL OMEGA-3 PO) Take by mouth.     Krill Oil (OMEGA-3) 500 MG CAPS Take 500 mg by mouth daily.     levothyroxine (SYNTHROID, LEVOTHROID) 75 MCG tablet levothyroxine 75 mcg tablet  TAKE 1 TABLET BY MOUTH ONCE DAILY FOR 90  DAYS     multivitamin-lutein (OCUVITE-LUTEIN) CAPS capsule Take 1 capsule by mouth daily.     Nutritional Supplements (JUICE PLUS FIBRE PO) Take by mouth.     propranolol ER (INDERAL LA) 60 MG 24 hr capsule Take 1 capsule by mouth daily.     pyridoxine (B-6) 100 MG tablet Take 100 mg by mouth daily.     rosuvastatin (CRESTOR) 10 MG tablet Take 1 tablet (10 mg total) by mouth daily. 90 tablet 3   saccharomyces boulardii (FLORASTOR) 250 MG capsule Take 250 mg by mouth 2 (two) times daily.     Vitamin E 180 MG CAPS Take 180 mg by mouth daily.     Current Facility-Administered Medications  Medication Dose Route Frequency Provider Last Rate Last Admin   0.9 %  sodium chloride infusion  500 mL Intravenous [place], [name], MD        Allergies as of 11/27/2021 - Review Complete 11/27/2021  Allergen Reaction Noted   Other Itching 03/02/2019    Family History  Problem Relation Age of Onset   Other Mother        essesntial tremors   Diabetes Mother    Pancreatic cancer Father    Heart disease Father    Heart disease Paternal Aunt    Heart disease Paternal Grandmother    Esophageal cancer Neg Hx    Colon cancer Neg Hx    Rectal cancer Neg Hx    Stomach cancer Neg Hx     Social History   Socioeconomic History   Marital status: Divorced    Spouse name: Not on file   Number of children: Not on file   Years of education: Not on file   Highest education level: Not on file  Occupational History   Not on file  Tobacco Use   Smoking status: Never   Smokeless tobacco: Never  Vaping Use   Vaping Use: Never used  Substance and Sexual Activity   Alcohol use: No   Drug use: No   Sexual activity: Not on file  Other Topics Concern   Not on file  Social History Narrative   Not on file   Social Determinants of Health   Financial Resource Strain: Low Risk    Difficulty of Paying Living Expenses: Not hard at all  Food Insecurity: No Food Insecurity   Worried About Paediatric nurse in the Last Year: Never true   [place] in the Last Year: Never true  Transportation Needs: No Transportation Needs   Lack of Transportation (Medical): No   Lack of Transportation (Non-Medical): No  Physical Activity: Sufficiently Active   Days of Exercise per Week: 5 days   Minutes of Exercise per  Session: 30 min  Stress: No Stress Concern Present   Feeling of Stress : Not at all  Social Connections: Socially Isolated   Frequency of Communication with Friends and Family: Twice a week   Frequency of Social Gatherings with Friends and Family: Twice a week   Attends Religious Services: Never   Marine scientist or Organizations: No   Attends Music therapist: Never   Marital Status: Divorced  Human resources officer Violence: Not At Risk   Fear of Current or Ex-Partner: No   Emotionally Abused: No   Physically Abused: No   Sexually Abused: No    Review of Systems: All other review of systems negative except as mentioned in the HPI.  Physical Exam: Vital signs BP 128/78    Pulse 64    Temp 98.4 F (36.9 C) (Temporal)    Resp 13    Ht 5\' 5"  (1.651 m)    Wt 192 lb (87.1 kg)    SpO2 98%    BMI 31.95 kg/m   General:   Alert,  Well-developed, pleasant and cooperative in NAD Lungs:  Clear throughout to auscultation.   Heart:  Regular rate and rhythm Abdomen:  Soft, nontender and nondistended.   Neuro/Psych:  Alert and cooperative. Normal mood and affect. A and O x 3  Jolly Mango, MD [facility]

## 2021-11-27 NOTE — Progress Notes (Signed)
Vitals-CW  History reviewed. 

## 2021-12-01 ENCOUNTER — Telehealth: Payer: Self-pay

## 2021-12-01 NOTE — Telephone Encounter (Signed)
No answer, left message to call back later today, B.Shreyan Hinz RN. 

## 2021-12-01 NOTE — Telephone Encounter (Signed)
°  Follow up Call-  Call back number 11/27/2021  Post procedure Call Back phone  # [phone]  Permission to leave phone message Yes  Some recent data might be hidden     Patient questions:  Do you have a fever, pain , or abdominal swelling? No. Pain Score  0 *  Have you tolerated food without any problems? No.  Have you been able to return to your normal activities? No.  Do you have any questions about your discharge instructions: Diet   No. Medications  No. Follow up visit  No.  Do you have questions or concerns about your Care? No.  Actions: * If pain score is 4 or above: No action needed, pain <4.  Have you developed a fever since your procedure? no  2.   Have you had an respiratory symptoms (SOB or cough) since your procedure? no  3.   Have you tested positive for COVID 19 since your procedure no  4.   Have you had any family members/close contacts diagnosed with the COVID 19 since your procedure?  no   If yes to any of these questions please route to Joylene John, RN and Joella Prince, RN

## 2021-12-03 ENCOUNTER — Encounter: Payer: Self-pay | Admitting: Dermatology

## 2021-12-03 NOTE — Progress Notes (Signed)
° °  Follow-Up Visit   Subjective  Gwendolyn Sampson is a 71 y.o. female who presents for the following: Annual Exam (Here for annual skin exam. No concerns just a few more crusty lesions. History of no mole skin cancers. ).  Annual skin examination Location:  Duration:  Quality:  Associated Signs/Symptoms: Modifying Factors:  Severity:  Timing: Context:   Objective  Well appearing patient in no apparent distress; mood and affect are within normal limits. [name] Skin Exam: No atypical pigmented lesions, no new or recurrent nonmelanoma skin cancer.  Smooth 1 mm dermal red papules.   Left Zygomatic Area, Right Malar Cheek Upper dermal half millimeter hard white papules   Right Buccal Cheek Patient asked to discuss topical therapies for her sun damage.  Left Upper Arm - Anterior Brown flattopped textured 6 mm papule      A full examination was performed including scalp, head, eyes, ears, nose, lips, neck, chest, axillae, abdomen, back, buttocks, bilateral upper extremities, bilateral lower extremities, hands, feet, fingers, toes, fingernails, and toenails. All findings within normal limits unless otherwise noted below.  Areas beneath undergarments not fully examined.   Assessment & Plan    Screening exam for skin cancer  Annual skin examination.  Encouraged to self examine twice annually.  Continued ultraviolet protection.  Discussed chemical peels   Milia (2) Left Zygomatic Area; Right Malar Cheek  Benign, ok to leave unless pt wants removed   Sun-damaged skin Right Buccal Cheek  Pt aware of the risks when using the retionol in the area. Ok to use few times a wk.  Seborrheic keratosis Left Upper Arm - Anterior  Benign, ok to leave unless pt wants removed or clinically changes  Cherry angioma  Benign. Ok to leave      I, Gwendolyn Monarch, MD, have reviewed all documentation for this visit.  The documentation on 12/03/21 for the exam, diagnosis, procedures,  and orders are all accurate and complete.

## 2021-12-11 ENCOUNTER — Ambulatory Visit (INDEPENDENT_AMBULATORY_CARE_PROVIDER_SITE_OTHER): Payer: Medicare Other | Admitting: Nurse Practitioner

## 2021-12-11 ENCOUNTER — Encounter: Payer: Self-pay | Admitting: Nurse Practitioner

## 2021-12-11 ENCOUNTER — Other Ambulatory Visit: Payer: Self-pay

## 2021-12-11 VITALS — BP 124/82 | HR 84 | Temp 98.0°F | Ht 65.0 in | Wt 188.8 lb

## 2021-12-11 DIAGNOSIS — R040 Epistaxis: Secondary | ICD-10-CM

## 2021-12-11 LAB — CBC WITH DIFFERENTIAL/PLATELET
Basophils Absolute: 0.1 10*3/uL (ref 0.0–0.1)
Basophils Relative: 1.1 % (ref 0.0–3.0)
Eosinophils Absolute: 0.3 10*3/uL (ref 0.0–0.7)
Eosinophils Relative: 4.8 % (ref 0.0–5.0)
HCT: 43.9 % (ref 36.0–46.0)
Hemoglobin: 14.3 g/dL (ref 12.0–15.0)
Lymphocytes Relative: 27.9 % (ref 12.0–46.0)
Lymphs Abs: 1.9 10*3/uL (ref 0.7–4.0)
MCHC: 32.6 g/dL (ref 30.0–36.0)
MCV: 88 fl (ref 78.0–100.0)
Monocytes Absolute: 0.5 10*3/uL (ref 0.1–1.0)
Monocytes Relative: 7.5 % (ref 3.0–12.0)
Neutro Abs: 4 10*3/uL (ref 1.4–7.7)
Neutrophils Relative %: 58.7 % (ref 43.0–77.0)
Platelets: 201 10*3/uL (ref 150.0–400.0)
RBC: 4.99 Mil/uL (ref 3.87–5.11)
RDW: 13.6 % (ref 11.5–15.5)
WBC: 6.8 10*3/uL (ref 4.0–10.5)

## 2021-12-11 MED ORDER — OXYMETAZOLINE HCL 0.05 % NA SOLN: 1.0000 | Freq: Two times a day (BID) | NASAL | 0 refills | Status: DC

## 2021-12-11 MED ORDER — SALINE SPRAY 0.65 % NA SOLN
1.0000 | NASAL | 0 refills | Status: DC | PRN
Start: 2021-12-11 — End: 2022-09-14

## 2021-12-11 NOTE — Patient Instructions (Signed)
Stop aspirin and vitamin E x 1week If no improvement will need referral to ENT Use humidifier at home  Nosebleed, Adult A nosebleed is when blood comes out of the nose. Nosebleeds are common and can be caused by many things. They are usually not a sign of a serious medical problem. Follow these instructions at home: When you have a nosebleed:  Sit down. Tilt your head a little forward. Follow these steps: Pinch your nose with a clean towel or tissue. Keep pinching your nose for 5 minutes. Do not let go. After 5 minutes, let go of your nose. If there is still bleeding, do these steps again. Keep doing these steps until the bleeding stops. Do not put tissues or other things in your nose to stop the bleeding. Avoid lying down or putting your head back. Use a nose spray decongestant as told by your doctor. After a nosebleed: Try not to blow your nose or sniffle for several hours. Try not to strain, lift, or bend at the waist for several days. Aspirin and blood-thinning medicines make bleeding more likely. If you take these medicines: Ask your doctor if you should stop taking them or if you should change how much you take. Do not stop taking the medicine unless your doctor tells you to. If your nosebleed was caused by dryness, use over-the-counter saline nasal spray or gel and humidifier as told by your doctor. This will keep the inside of your nose moist and allow it to heal. If you need to use one of these products: Choose one that is water-soluble. Use only as much as you need and use it only as often as needed. Do not lie down right away after you use it. If you get nosebleeds often, talk with your doctor about treatments. These may include: Nasal cautery. A chemical swab or electrical device is used to lightly burn tiny blood vessels inside the nose. This helps stop or prevent nosebleeds. Nasal packing. A gauze or other material is placed in the nose to keep constant pressure on the  bleeding area. Contact a doctor if: You have a fever. You get nosebleeds often. You are getting nosebleeds more often than usual. You bruise very easily. You have something stuck in your nose. You have bleeding in your mouth. You vomit or cough up brown material. You get a nosebleed after you start a new medicine. Get help right away if: You have a nosebleed after you fall or hurt your head. Your nosebleed does not go away after 20 minutes. You feel dizzy or weak. You have unusual bleeding from other parts of your body. You have unusual bruising on other parts of your body. You get sweaty. You vomit blood. Summary Nosebleeds are common. They are usually not a sign of a serious medical problem. When you have a nosebleed, sit down and tilt your head a little forward. Pinch your nose with a clean tissue for 5 minutes. Use saline spray or saline gel and a humidifier as told by your doctor. Get help right away if your nosebleed does not go away after 20 minutes. This information is not intended to replace advice given to you by your health care provider. Make sure you discuss any questions you have with your health care provider. Document Revised: 09/07/2019 Document Reviewed: 09/07/2019 Elsevier Patient Education  2022 [employer].

## 2021-12-11 NOTE — Progress Notes (Signed)
Subjective:  Patient ID: Marlette Curvin, female    DOB: 1951/07/11  Age: 71 y.o. MRN: [id number]  CC: Acute Visit (Pt c/o nose bleeds out of right nostril. Bleeding started this month and happens every 2-3 days. Pt states bleeding occurs randomly during the day. )  Epistaxis  The bleeding has been from the right nare. This is a new problem. The current episode started 1 to 4 weeks ago. The problem has been waxing and waning. The bleeding is associated with aspirin. She has tried ice and pressure for the symptoms. The treatment provided moderate relief. There is no history of allergies, a bleeding disorder, colds, frequent nosebleeds or sinus problems.    Reviewed past Medical, Social and Family history today.  Outpatient Medications Prior to Visit  Medication Sig Dispense Refill   acetaminophen (TYLENOL) 325 MG tablet acetaminophen     aspirin EC 81 MG tablet Take 81 mg by mouth daily.     Biotin 1000 MCG CHEW Chew 1,000 mcg by mouth daily.     Cholecalciferol (VITAMIN D-3) 25 MCG (1000 UT) CAPS Take by mouth.     Docosahexaenoic Acid (DHA NATURAL OMEGA-3 PO) Take by mouth.     Krill Oil (OMEGA-3) 500 MG CAPS Take 500 mg by mouth daily.     levothyroxine (SYNTHROID, LEVOTHROID) 75 MCG tablet levothyroxine 75 mcg tablet  TAKE 1 TABLET BY MOUTH ONCE DAILY FOR 90 DAYS     multivitamin-lutein (OCUVITE-LUTEIN) CAPS capsule Take 1 capsule by mouth daily.     Nutritional Supplements (JUICE PLUS FIBRE PO) Take by mouth.     omeprazole (PRILOSEC) 20 MG capsule Take 1 capsule (20 mg total) by mouth daily. 90 capsule 1   propranolol ER (INDERAL LA) 60 MG 24 hr capsule Take 1 capsule by mouth daily.     pyridoxine (B-6) 100 MG tablet Take 100 mg by mouth daily.     rosuvastatin (CRESTOR) 10 MG tablet Take 1 tablet (10 mg total) by mouth daily. 90 tablet 3   saccharomyces boulardii (FLORASTOR) 250 MG capsule Take 250 mg by mouth 2 (two) times daily.     Vitamin E 180 MG CAPS Take 180 mg by mouth  daily.     No facility-administered medications prior to visit.    ROS See HPI  Objective:  BP 124/82 (BP Location: Left Arm, Patient Position: Sitting, Cuff Size: Large)    Pulse 84    Temp 98 F (36.7 C) (Temporal)    Ht 5\' 5"  (1.651 m)    Wt 188 lb 12.8 oz (85.6 kg)    SpO2 98%    BMI 31.42 kg/m   Physical Exam Vitals reviewed.  HENT:     Head:     Jaw: There is normal jaw occlusion.     Nose: Mucosal edema present. No nasal tenderness, congestion or rhinorrhea.     Right Nostril: No foreign body, epistaxis, septal hematoma or occlusion.     Left Nostril: No foreign body, epistaxis, septal hematoma or occlusion.     Right Turbinates: Swollen. Not enlarged or pale.     Left Turbinates: Not enlarged, swollen or pale.     Right Sinus: No maxillary sinus tenderness or frontal sinus tenderness.     Left Sinus: No maxillary sinus tenderness or frontal sinus tenderness.  Cardiovascular:     Rate and Rhythm: Normal rate.     Pulses: Normal pulses.  Pulmonary:     Effort: Pulmonary effort is normal.  Musculoskeletal:  Cervical back: Normal range of motion and neck supple.  Neurological:     Mental Status: She is alert and oriented to person, place, and time.   Assessment & Plan:  This visit occurred during the SARS-CoV-2 public health emergency.  Safety protocols were in place, including screening questions prior to the visit, additional usage of staff PPE, and extensive cleaning of exam room while observing appropriate contact time as indicated for disinfecting solutions.   Anglea was seen today for acute visit.  Diagnoses and all orders for this visit:  Epistaxis, recurrent -     CBC w/Diff -     oxymetazoline (AFRIN NASAL SPRAY) 0.05 % nasal spray; Place 1 spray into right nostril 2 (two) times daily. Use only for 3days, then stop -     sodium chloride (OCEAN) 0.65 % SOLN nasal spray; Place 1 spray into both nostrils as needed for congestion.  Stop aspirin and vitamin E x  1week If no improvement will need referral to ENT Use humidifier at home  Problem List Items Addressed This Visit   None Visit Diagnoses     Epistaxis, recurrent    -  Primary   Relevant Medications   oxymetazoline (AFRIN NASAL SPRAY) 0.05 % nasal spray   sodium chloride (OCEAN) 0.65 % SOLN nasal spray   Other Relevant Orders   CBC w/Diff       Follow-up: No follow-ups on file.  Wilfred Lacy, NP

## 2021-12-23 ENCOUNTER — Ambulatory Visit: Payer: Medicare Other | Admitting: Nurse Practitioner

## 2022-01-19 DIAGNOSIS — Z961 Presence of intraocular lens: Secondary | ICD-10-CM | POA: Insufficient documentation

## 2022-01-19 DIAGNOSIS — H04123 Dry eye syndrome of bilateral lacrimal glands: Secondary | ICD-10-CM | POA: Insufficient documentation

## 2022-01-20 ENCOUNTER — Other Ambulatory Visit: Payer: Self-pay

## 2022-01-20 ENCOUNTER — Ambulatory Visit (INDEPENDENT_AMBULATORY_CARE_PROVIDER_SITE_OTHER): Payer: Medicare Other | Admitting: Nurse Practitioner

## 2022-01-20 ENCOUNTER — Encounter: Payer: Self-pay | Admitting: Nurse Practitioner

## 2022-01-20 VITALS — BP 100/70 | HR 76 | Temp 97.7°F | Ht 65.0 in | Wt 180.0 lb

## 2022-01-20 DIAGNOSIS — M8589 Other specified disorders of bone density and structure, multiple sites: Secondary | ICD-10-CM | POA: Diagnosis not present

## 2022-01-20 DIAGNOSIS — E782 Mixed hyperlipidemia: Secondary | ICD-10-CM

## 2022-01-20 DIAGNOSIS — I700 Atherosclerosis of aorta: Secondary | ICD-10-CM | POA: Diagnosis not present

## 2022-01-20 DIAGNOSIS — R739 Hyperglycemia, unspecified: Secondary | ICD-10-CM | POA: Insufficient documentation

## 2022-01-20 DIAGNOSIS — M25511 Pain in right shoulder: Secondary | ICD-10-CM

## 2022-01-20 DIAGNOSIS — K5904 Chronic idiopathic constipation: Secondary | ICD-10-CM | POA: Insufficient documentation

## 2022-01-20 DIAGNOSIS — Z0001 Encounter for general adult medical examination with abnormal findings: Secondary | ICD-10-CM

## 2022-01-20 MED ORDER — DICLOFENAC SODIUM 1 % EX GEL: 2.0000 g | Freq: Four times a day (QID) | CUTANEOUS | 0 refills | Status: DC

## 2022-01-20 NOTE — Patient Instructions (Signed)
Schedule fasting blood draw. Need to be fasting 8hrs prior to blood draw. Ok to drink water.  You will be contacted to schedule appt for bone density.  Please bring a copy of living will and health care power of attorney.  Start shoulder exercise and use of Voltaren gel to help with should pain  Shoulder Exercises Ask your health care provider which exercises are safe for you. Do exercises exactly as told by your health care provider and adjust them as directed. It is normal to feel mild stretching, pulling, tightness, or discomfort as you do these exercises. Stop right away if you feel sudden pain or your pain gets worse. Do not begin these exercises until told by your health care provider. Stretching exercises External rotation and abduction This exercise is sometimes called corner stretch. This exercise rotates your arm outward (external rotation) and moves your arm out from your body (abduction). Stand in a doorway with one of your feet slightly in front of the other. This is called a staggered stance. If you cannot reach your forearms to the door frame, stand facing a corner of a room. Choose one of the following positions as told by your health care provider: Place your hands and forearms on the door frame above your head. Place your hands and forearms on the door frame at the height of your head. Place your hands on the door frame at the height of your elbows. Slowly move your weight onto your front foot until you feel a stretch across your chest and in the front of your shoulders. Keep your head and chest upright and keep your abdominal muscles tight. Hold for __________ seconds. To release the stretch, shift your weight to your back foot. Repeat __________ times. Complete this exercise __________ times a day. Extension, standing Stand and hold a broomstick, a cane, or a similar object behind your back. Your hands should be a little wider than shoulder width apart. Your palms should  face away from your back. Keeping your elbows straight and your shoulder muscles relaxed, move the stick away from your body until you feel a stretch in your shoulders (extension). Avoid shrugging your shoulders while you move the stick. Keep your shoulder blades tucked down toward the middle of your back. Hold for __________ seconds. Slowly return to the starting position. Repeat __________ times. Complete this exercise __________ times a day. Range-of-motion exercises Pendulum  Stand near a wall or a surface that you can hold onto for balance. Bend at the waist and let your left / right arm hang straight down. Use your other arm to support you. Keep your back straight and do not lock your knees. Relax your left / right arm and shoulder muscles, and move your hips and your trunk so your left / right arm swings freely. Your arm should swing because of the motion of your body, not because you are using your arm or shoulder muscles. Keep moving your hips and trunk so your arm swings in the following directions, as told by your health care provider: Side to side. Forward and backward. In clockwise and counterclockwise circles. Continue each motion for __________ seconds, or for as long as told by your health care provider. Slowly return to the starting position. Repeat __________ times. Complete this exercise __________ times a day. Shoulder flexion, standing  Stand and hold a broomstick, a cane, or a similar object. Place your hands a little more than shoulder width apart on the object. Your left / right  hand should be palm up, and your other hand should be palm down. Keep your elbow straight and your shoulder muscles relaxed. Push the stick up with your healthy arm to raise your left / right arm in front of your body, and then over your head until you feel a stretch in your shoulder (flexion). Avoid shrugging your shoulder while you raise your arm. Keep your shoulder blade tucked down toward  the middle of your back. Hold for __________ seconds. Slowly return to the starting position. Repeat __________ times. Complete this exercise __________ times a day. Shoulder abduction, standing Stand and hold a broomstick, a cane, or a similar object. Place your hands a little more than shoulder width apart on the object. Your left / right hand should be palm up, and your other hand should be palm down. Keep your elbow straight and your shoulder muscles relaxed. Push the object across your body toward your left / right side. Raise your left / right arm to the side of your body (abduction) until you feel a stretch in your shoulder. Do not raise your arm above shoulder height unless your health care provider tells you to do that. If directed, raise your arm over your head. Avoid shrugging your shoulder while you raise your arm. Keep your shoulder blade tucked down toward the middle of your back. Hold for __________ seconds. Slowly return to the starting position. Repeat __________ times. Complete this exercise __________ times a day. Internal rotation  Place your left / right hand behind your back, palm up. Use your other hand to dangle an exercise band, a towel, or a similar object over your shoulder. Grasp the band with your left / right hand so you are holding on to both ends. Gently pull up on the band until you feel a stretch in the front of your left / right shoulder. The movement of your arm toward the center of your body is called internal rotation. Avoid shrugging your shoulder while you raise your arm. Keep your shoulder blade tucked down toward the middle of your back. Hold for __________ seconds. Release the stretch by letting go of the band and lowering your hands. Repeat __________ times. Complete this exercise __________ times a day. Strengthening exercises External rotation  Sit in a stable chair without armrests. Secure an exercise band to a stable object at elbow height on  your left / right side. Place a soft object, such as a folded towel or a small pillow, between your left / right upper arm and your body to move your elbow about 4 inches (10 cm) away from your side. Hold the end of the exercise band so it is tight and there is no slack. Keeping your elbow pressed against the soft object, slowly move your forearm out, away from your abdomen (external rotation). Keep your body steady so only your forearm moves. Hold for __________ seconds. Slowly return to the starting position. Repeat __________ times. Complete this exercise __________ times a day. Shoulder abduction  Sit in a stable chair without armrests, or stand up. Hold a __________ weight in your left / right hand, or hold an exercise band with both hands. Start with your arms straight down and your left / right palm facing in, toward your body. Slowly lift your left / right hand out to your side (abduction). Do not lift your hand above shoulder height unless your health care provider tells you that this is safe. Keep your arms straight. Avoid shrugging your shoulder  while you do this movement. Keep your shoulder blade tucked down toward the middle of your back. Hold for __________ seconds. Slowly lower your arm, and return to the starting position. Repeat __________ times. Complete this exercise __________ times a day. Shoulder extension Sit in a stable chair without armrests, or stand up. Secure an exercise band to a stable object in front of you so it is at shoulder height. Hold one end of the exercise band in each hand. Your palms should face each other. Straighten your elbows and lift your hands up to shoulder height. Step back, away from the secured end of the exercise band, until the band is tight and there is no slack. Squeeze your shoulder blades together as you pull your hands down to the sides of your thighs (extension). Stop when your hands are straight down by your sides. Do not let your  hands go behind your body. Hold for __________ seconds. Slowly return to the starting position. Repeat __________ times. Complete this exercise __________ times a day. Shoulder row Sit in a stable chair without armrests, or stand up. Secure an exercise band to a stable object in front of you so it is at waist height. Hold one end of the exercise band in each hand. Position your palms so that your thumbs are facing the ceiling (neutral position). Bend each of your elbows to a 90-degree angle (right angle) and keep your upper arms at your sides. Step back until the band is tight and there is no slack. Slowly pull your elbows back behind you. Hold for __________ seconds. Slowly return to the starting position. Repeat __________ times. Complete this exercise __________ times a day. Shoulder press-ups  Sit in a stable chair that has armrests. Sit upright, with your feet flat on the floor. Put your hands on the armrests so your elbows are bent and your fingers are pointing forward. Your hands should be about even with the sides of your body. Push down on the armrests and use your arms to lift yourself off the chair. Straighten your elbows and lift yourself up as much as you comfortably can. Move your shoulder blades down, and avoid letting your shoulders move up toward your ears. Keep your feet on the ground. As you get stronger, your feet should support less of your body weight as you lift yourself up. Hold for __________ seconds. Slowly lower yourself back into the chair. Repeat __________ times. Complete this exercise __________ times a day. Wall push-ups  Stand so you are facing a stable wall. Your feet should be about one arm-length away from the wall. Lean forward and place your palms on the wall at shoulder height. Keep your feet flat on the floor as you bend your elbows and lean forward toward the wall. Hold for __________ seconds. Straighten your elbows to push yourself back to the  starting position. Repeat __________ times. Complete this exercise __________ times a day. This information is not intended to replace advice given to you by your health care provider. Make sure you discuss any questions you have with your health care provider. Document Revised: 03/03/2019 Document Reviewed: 12/09/2018 Elsevier Patient Education  [place].

## 2022-01-20 NOTE — Progress Notes (Signed)
Subjective:    Patient ID: Gwendolyn Sampson, female    DOB: [birth year], 71 y.o.   MRN: [id number]  Patient presents today for CPE (establish) eval of chronic conditions   Shoulder Pain  The pain is present in the right shoulder and right arm. This is a chronic problem. The current episode started more than 1 year ago. There has been no history of extremity trauma. The problem occurs constantly. The problem has been gradually worsening. The quality of the pain is described as aching and dull. The pain is moderate. Pertinent negatives include no fever, inability to bear weight, itching, joint locking, joint swelling or limited range of motion. The symptoms are aggravated by activity. She has tried nothing for the symptoms. Family history does not include gout or rheumatoid arthritis. Her past medical history is significant for osteoarthritis. There is no history of diabetes, gout or rheumatoid arthritis.  Hyperglycemia Repeat hgbA1c  Hyperlipidemia Repeat lipid panel  Osteopenia of multiple sites Last dexa scan 2017: t-score of -2.4 Repeat dexa scan  Vision:up to date Dental:up to date Exercise:stationery bicycle daily Weight:  Wt Readings from Last 3 Encounters:  01/20/22 180 lb (81.6 kg)  12/11/21 188 lb 12.8 oz (85.6 kg)  11/27/21 192 lb (87.1 kg)    Sexual History: Mammogram: up to date, will request report Breast and pelvic exam completed by GYN S/p hystrectomy  Depression/Suicide: Depression screen [facility] 2/9 11/05/2021 11/05/2021 08/08/2021  Decreased Interest 0 0 0  Down, Depressed, Hopeless 0 0 0  PHQ - 2 Score 0 0 0  Altered sleeping - - 2  Tired, decreased energy - - 0  Change in appetite - - 0  Feeling bad or failure about yourself  - - 0  Trouble concentrating - - 0  Moving slowly or fidgety/restless - - 0  Suicidal thoughts - - 0  PHQ-9 Score - - 2  Difficult doing work/chores - - Not difficult at all   Immunizations: (TDAP, Hep C screen, Pneumovax, Influenza,  zoster)  Health Maintenance  Topic Date Due   Hepatitis C Screening: USPSTF Recommendation to screen - Ages 37-79 yo.  Never done   Mammogram  09/03/2021   COVID-19 Vaccine (5 - Booster) 09/30/2021   Colon Cancer Screening  09/20/2023   Tetanus Vaccine  03/22/2031   Pneumonia Vaccine  Completed   Flu Shot  Completed   DEXA scan (bone density measurement)  Completed   Zoster (Shingles) Vaccine  Completed   HPV Vaccine  Aged Out   Fall Risk: Fall Risk  11/05/2021  Falls in the past year? 0  Number falls in past yr: 0  Injury with Fall? 0  Follow up Falls evaluation completed   Advanced Directive: requested copy Advanced Directives 11/05/2021  Does Patient Have a Medical Advance Directive? Yes  Type of Paramedic of [place];Living will  Does patient want to make changes to medical advance directive? -  Copy of [place] in Chart? No - copy requested    Medications and allergies reviewed with patient and updated if appropriate.  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     Biotin 1000 MCG CHEW Chew 1,000 mcg by mouth daily.      Cholecalciferol (VITAMIN D-3) 25 MCG (1000 UT) CAPS Take by mouth.     Docosahexaenoic Acid (DHA NATURAL OMEGA-3 PO) Take by mouth.     Krill Oil (OMEGA-3) 500 MG CAPS Take 500 mg by mouth daily.     levothyroxine (SYNTHROID, LEVOTHROID) 75 MCG tablet levothyroxine 75 mcg tablet  TAKE 1 TABLET BY MOUTH ONCE DAILY FOR 90 DAYS     multivitamin-lutein (OCUVITE-LUTEIN) CAPS capsule Take 1 capsule by mouth daily.     Nutritional Supplements (JUICE PLUS FIBRE PO) Take by mouth.     omeprazole (PRILOSEC) 20 MG capsule Take 1 capsule (20 mg total) by mouth daily. 90 capsule 1   oxymetazoline (AFRIN NASAL SPRAY) 0.05 % nasal spray Place 1 spray into right nostril 2 (two) times daily. Use only for 3days, then stop 15 mL 0   propranolol ER (INDERAL LA) 60 MG 24 hr capsule Take 1 capsule by mouth daily.     pyridoxine (B-6) 100 MG tablet Take 100 mg by mouth daily.     rosuvastatin (CRESTOR) 10 MG tablet Take 1 tablet (10 mg total) by mouth daily. 90 tablet 3   saccharomyces boulardii (FLORASTOR) 250 MG capsule Take 250 mg by mouth 2 (two) times daily.     sodium chloride (OCEAN) 0.65 % SOLN nasal spray Place 1 spray into both nostrils as needed for congestion. 15 mL 0   Vitamin E 180 MG CAPS Take 180 mg by mouth daily.     levothyroxine (SYNTHROID) 75 MCG tablet Take 1 tablet by mouth daily.     propranolol (INDERAL) 20 MG tablet Take 20 mg by mouth daily.     No current facility-administered medications on file prior to visit.    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 No typical groundglass type peripheral infiltrates to suggest viral pneumonia 2.  Peripheral opacities in all lobes of both lungs with an upper lobe predominance, presumed fibrosis given the history of interstitial lung disease. - Covid IgG   03/31/2019  Positive   - 04/19/2019   Walked RA  2 laps @  approx 229ft each @ very fast pace  stopped due to   MRSA (methicillin resistant Staphylococcus aureus) 2010   wrist, waist, buttock   Neuromuscular disorder ([place])    Past Surgical History:  Procedure Laterality Date   EYE SURGERY     cataract   LIPOMA EXCISION     on bacl   STRABISMUS SURGERY Bilateral 10/22/2017   Procedure: REPAIR STRABISMUS BILATERAL;  Surgeon: Everitt Amber, MD;  Location: [facility]  [place];  Service: Ophthalmology;  Laterality: Bilateral;   TONSILLECTOMY     Social History   Socioeconomic History   Marital status: Divorced    Spouse name: Not on file   Number of children: Not on file   Years of education: Not on file   Highest education level: Not on file  Occupational History   Not on file  Tobacco Use   Smoking status: Never   Smokeless tobacco: Never  Vaping Use   Vaping Use: Never used  Substance and Sexual Activity   Alcohol use: No   Drug use: No   Sexual activity: Not Currently    Birth control/protection: Post-menopausal  Other Topics Concern   Not on file  Social History Narrative   Not on file   Social Determinants of Health   Financial Resource Strain: Low Risk    Difficulty of Paying Living Expenses: Not hard at all  Food Insecurity: No Food Insecurity   Worried About Charity fundraiser in the Last Year: Never true   [place] in the Last Year: Never true  Transportation Needs: No Transportation Needs   Lack of Transportation (Medical): No   Lack of Transportation (Non-Medical): No  Physical Activity: Sufficiently Active   Days of Exercise per Week: 5 days   Minutes of Exercise per Session: 30 min  Stress: No Stress Concern Present   Feeling of Stress : Not at all  Social Connections: Socially  Isolated   Frequency of Communication with Friends and Family: Twice a week   Frequency of Social Gatherings with Friends and Family: Twice a week   Attends Religious Services: Never   Marine scientist or Organizations: No   Attends Music therapist: Never   Marital Status: Divorced   Family History  Problem Relation Age of Onset   Other Mother        essesntial tremors   Diabetes Mother    Dementia Mother    Pancreatic cancer Father    Heart disease Father    Heart disease Paternal Aunt    Heart disease Paternal Grandmother    Esophageal cancer Neg Hx    Colon cancer Neg Hx    Rectal cancer Neg Hx    Stomach cancer Neg Hx        Review of Systems  Constitutional:  Negative for fever, malaise/fatigue and weight loss.  HENT:  Negative for congestion and sore throat.   Eyes:        Negative for visual changes  Respiratory:  Negative for cough and shortness of breath.   Cardiovascular:  Negative for chest pain, palpitations and leg swelling.  Gastrointestinal:  Negative for blood in stool, constipation, diarrhea and heartburn.  Genitourinary:  Negative for dysuria, frequency and urgency.  Musculoskeletal:  Positive for joint pain. Negative for falls, gout, myalgias and neck pain.  Skin:  Negative for itching and rash.  Neurological:  Negative for dizziness, sensory change and headaches.  Endo/Heme/Allergies:  Does not bruise/bleed easily.  Psychiatric/Behavioral:  Negative for depression, substance abuse and suicidal ideas. The patient is not nervous/anxious.    Objective:   Vitals:   01/20/22 1329  BP: 100/70  Pulse: 76  Temp: 97.7 F (36.5 C)  SpO2: 98%    Body mass index is 29.95 kg/m.  Physical Examination:  Physical Exam Vitals reviewed.  Constitutional:      General: She is not in acute distress.    Appearance: She is well-developed.  HENT:     Right Ear: Tympanic membrane, ear canal and external ear normal.     Left Ear: Tympanic  membrane, ear canal and external ear normal.  Eyes:     Extraocular Movements: Extraocular movements intact.     Conjunctiva/sclera: Conjunctivae normal.     Pupils: Pupils are equal, round, and reactive to light.  Cardiovascular:     Rate and Rhythm: Normal rate and regular rhythm.     Pulses: Normal pulses.     Heart sounds: Normal heart sounds.  Pulmonary:     Effort: Pulmonary effort is normal. No respiratory distress.     Breath sounds: Normal breath sounds.  Chest:     Chest wall: No tenderness.  Abdominal:     General: Bowel sounds are normal.     Palpations: Abdomen is soft.  Musculoskeletal:        General: Tenderness present. No swelling. Normal range of motion.     Right shoulder: Tenderness present. No bony tenderness or crepitus. Normal range of motion. Normal strength.     Left shoulder: Normal.     Right upper arm: Normal.     Left upper arm: Normal.     Cervical back: Normal range of motion and neck supple.  Lymphadenopathy:     Cervical: No cervical adenopathy.  Skin:    General: Skin is warm and dry.  Neurological:     Mental Status: She is alert and oriented to person, place, and time.     Deep Tendon Reflexes: Reflexes are normal and symmetric.  Psychiatric:        Mood and Affect: Mood normal.        Behavior: Behavior normal.        Thought Content: Thought content normal.   ASSESSMENT and PLAN: This visit occurred during the SARS-CoV-2 public health emergency.  Safety protocols were in place, including screening questions prior to the visit, additional usage of staff PPE, and extensive cleaning of exam room while observing appropriate contact time as indicated for disinfecting solutions.   Teresha was seen today for annual exam.  Diagnoses and all orders for this visit:  Encounter for preventative adult health care exam with abnormal findings -     Comprehensive metabolic panel; Future  Aortic atherosclerosis (HCC) -     Lipid panel; Future  Mixed  hyperlipidemia -     Lipid panel; Future  Right anterior shoulder pain -     diclofenac Sodium (VOLTAREN) 1 % GEL; Apply 2 g topically 4 (four) times daily.  Osteopenia of multiple sites -     DG Bone Density; Future  Hyperglycemia -     Hemoglobin A1c; Future  Schedule fasting blood draw. Need to be fasting 8hrs prior to blood draw. Ok to drink water. You will be contacted to schedule appt for bone density. Please bring a copy of living will and health care power of attorney. Start shoulder exercise and use of Voltaren gel to help with should pain    Problem List Items Addressed This Visit       Cardiovascular and Mediastinum   Aortic atherosclerosis (HCC)   Relevant Medications   propranolol (INDERAL) 20 MG tablet   Other Relevant Orders   Lipid panel     Musculoskeletal and Integument   Osteopenia of multiple sites    Last dexa scan 2017: t-score of -2.4 Repeat dexa scan      Relevant Orders   DG Bone Density     Other  Hyperglycemia    Repeat hgbA1c      Relevant Orders   Hemoglobin A1c   Hyperlipidemia    Repeat lipid panel      Relevant Medications   propranolol (INDERAL) 20 MG tablet   Other Relevant Orders   Lipid panel   Other Visit Diagnoses     Encounter for preventative adult health care exam with abnormal findings    -  Primary   Relevant Orders   Comprehensive metabolic panel   Right anterior shoulder pain       Relevant Medications   diclofenac Sodium (VOLTAREN) 1 % GEL       Follow up: Return in about 1 year (around 01/20/2023) for CPE (fasting).  Wilfred Lacy, NP

## 2022-01-20 NOTE — Assessment & Plan Note (Deleted)
Last dexa scan 2017: t-score of -2.4 Repeat dexa scan

## 2022-01-20 NOTE — Assessment & Plan Note (Signed)
Repeat hgbA1c 

## 2022-01-20 NOTE — Assessment & Plan Note (Signed)
Last dexa scan 2017: t-score of -2.4 Repeat dexa scan

## 2022-01-20 NOTE — Assessment & Plan Note (Signed)
Repeat lipid panel ?

## 2022-01-21 ENCOUNTER — Encounter: Payer: Self-pay | Admitting: Nurse Practitioner

## 2022-01-22 ENCOUNTER — Other Ambulatory Visit: Payer: Self-pay

## 2022-01-22 ENCOUNTER — Other Ambulatory Visit (INDEPENDENT_AMBULATORY_CARE_PROVIDER_SITE_OTHER): Payer: Medicare Other

## 2022-01-22 DIAGNOSIS — E782 Mixed hyperlipidemia: Secondary | ICD-10-CM

## 2022-01-22 DIAGNOSIS — I700 Atherosclerosis of aorta: Secondary | ICD-10-CM

## 2022-01-22 DIAGNOSIS — Z0001 Encounter for general adult medical examination with abnormal findings: Secondary | ICD-10-CM | POA: Diagnosis not present

## 2022-01-22 DIAGNOSIS — R739 Hyperglycemia, unspecified: Secondary | ICD-10-CM | POA: Diagnosis not present

## 2022-01-22 LAB — COMPREHENSIVE METABOLIC PANEL
ALT: 20 U/L (ref 0–35)
AST: 27 U/L (ref 0–37)
Albumin: 4.4 g/dL (ref 3.5–5.2)
Alkaline Phosphatase: 63 U/L (ref 39–117)
BUN: 19 mg/dL (ref 6–23)
CO2: 27 mEq/L (ref 19–32)
Calcium: 9.9 mg/dL (ref 8.4–10.5)
Chloride: 105 mEq/L (ref 96–112)
Creatinine, Ser: 0.85 mg/dL (ref 0.40–1.20)
GFR: 70.39 mL/min (ref >60.00)
Glucose, Bld: 82 mg/dL (ref 70–99)
Potassium: 4.6 mEq/L (ref 3.5–5.1)
Sodium: 139 mEq/L (ref 135–145)
Total Bilirubin: 0.9 mg/dL (ref 0.2–1.2)
Total Protein: 7.2 g/dL (ref 6.0–8.3)

## 2022-01-22 LAB — LIPID PANEL
Cholesterol: 154 mg/dL (ref 0–200)
HDL: 70.4 mg/dL (ref >39.00)
LDL Cholesterol: 70 mg/dL (ref 0–99)
NonHDL: 83.49
Total CHOL/HDL Ratio: 2
Triglycerides: 65 mg/dL (ref 0.0–149.0)
VLDL: 13 mg/dL (ref 0.0–40.0)

## 2022-01-22 LAB — HEMOGLOBIN A1C: Hgb A1c MFr Bld: 5.3 % (ref 4.6–6.5)

## 2022-01-27 ENCOUNTER — Other Ambulatory Visit: Payer: Self-pay | Admitting: Nurse Practitioner

## 2022-01-27 DIAGNOSIS — E785 Hyperlipidemia, unspecified: Secondary | ICD-10-CM

## 2022-04-03 NOTE — Telephone Encounter (Signed)
Please request records

## 2022-04-21 ENCOUNTER — Other Ambulatory Visit: Payer: Self-pay | Admitting: Endocrinology

## 2022-04-21 DIAGNOSIS — E049 Nontoxic goiter, unspecified: Secondary | ICD-10-CM

## 2022-04-22 ENCOUNTER — Other Ambulatory Visit: Payer: Medicare Other

## 2022-04-22 ENCOUNTER — Ambulatory Visit
Admission: RE | Admit: 2022-04-22 | Discharge: 2022-04-22 | Disposition: A | Discharge status: Home / Self Care | Payer: Medicare Other | Source: Ambulatory Visit | Attending: Endocrinology | Admitting: Endocrinology

## 2022-04-22 DIAGNOSIS — E049 Nontoxic goiter, unspecified: Secondary | ICD-10-CM

## 2022-04-23 ENCOUNTER — Ambulatory Visit (HOSPITAL_COMMUNITY)
Admission: RE | Admit: 2022-04-23 | Discharge: 2022-04-23 | Disposition: A | Discharge status: Home / Self Care | Payer: Medicare Other | Source: Ambulatory Visit | Attending: Cardiology | Admitting: Cardiology

## 2022-04-23 ENCOUNTER — Telehealth: Payer: Self-pay | Admitting: Cardiology

## 2022-04-23 DIAGNOSIS — R0789 Other chest pain: Secondary | ICD-10-CM | POA: Insufficient documentation

## 2022-04-23 MED ORDER — IOHEXOL 350 MG/ML SOLN
100.0000 mL | Freq: Once | INTRAVENOUS | Status: AC | PRN
  Administered 2022-04-23: 100 mL via INTRAVENOUS

## 2022-04-23 NOTE — Telephone Encounter (Signed)
Per [facility] Radiology with results of Chest CT angio IMPRESSION: 1. Ascending thoracic aortic aneurysm measuring up to 4.2 cm.  Will route to ordering provider.

## 2022-04-23 NOTE — Telephone Encounter (Signed)
[facility] Radiology is calling to report a STAT call report of a significant finding

## 2022-04-30 ENCOUNTER — Telehealth: Payer: Self-pay

## 2022-04-30 ENCOUNTER — Encounter: Payer: Self-pay | Admitting: Cardiology

## 2022-04-30 NOTE — Telephone Encounter (Signed)
Left message for pt to return call to set up MyChart visit with Dr. Harriet Masson next week.

## 2022-04-30 NOTE — Telephone Encounter (Signed)
Spoke to pt. See chart.

## 2022-04-30 NOTE — Telephone Encounter (Signed)
Pt returned call. I was able to set her up with a MyChart visit with Dr. Harriet Masson 6/14

## 2022-05-06 ENCOUNTER — Telehealth (INDEPENDENT_AMBULATORY_CARE_PROVIDER_SITE_OTHER): Payer: Medicare Other | Admitting: Cardiology

## 2022-05-06 VITALS — BP 117/76 | HR 59 | Ht 65.0 in | Wt 170.0 lb

## 2022-05-06 DIAGNOSIS — I700 Atherosclerosis of aorta: Secondary | ICD-10-CM | POA: Diagnosis not present

## 2022-05-06 DIAGNOSIS — I10 Essential (primary) hypertension: Secondary | ICD-10-CM

## 2022-05-06 DIAGNOSIS — I7121 Aneurysm of the ascending aorta, without rupture: Secondary | ICD-10-CM | POA: Diagnosis not present

## 2022-05-06 NOTE — Patient Instructions (Signed)
Medication Instructions:  Your physician recommends that you continue on your current medications as directed. Please refer to the Current Medication list given to you today.  *If you need a refill on your cardiac medications before your next appointment, please call your pharmacy*   Testing/Procedures: Non-Cardiac CT Angiography (CTA), is a special type of CT scan that uses a computer to produce multi-dimensional views of major blood vessels throughout the body. In CT angiography, a contrast material is injected through an IV to help visualize the blood vessels. To be done in June 2024.    Follow-Up: At [facility], you and your health needs are our priority.  As part of our continuing mission to provide you with exceptional heart care, we have created designated Provider Care Teams.  These Care Teams include your primary Cardiologist (physician) and Advanced Practice Providers (APPs -  Physician Assistants and Nurse Practitioners) who all work together to provide you with the care you need, when you need it.  We recommend signing up for the patient portal called "MyChart".  Sign up information is provided on this After Visit Summary.  MyChart is used to connect with patients for Virtual Visits (Telemedicine).  Patients are able to view lab/test results, encounter notes, upcoming appointments, etc.  Non-urgent messages can be sent to your provider as well.   To learn more about what you can do with MyChart, go to [email or web address].    Your next appointment:   12 month(s)  The format for your next appointment:   In Person  Provider:   Berniece Salines, DO

## 2022-05-06 NOTE — Progress Notes (Signed)
Virtual Visit via Video Note   Because of [name]'s co-morbid illnesses, she is at least at moderate risk for complications without adequate follow up.  This format is felt to be most appropriate for this patient at this time.  All issues noted in this document were discussed and addressed.  A limited physical exam was performed with this format.  Please refer to the patient's chart for her consent to telehealth for [facility].      Date:  05/06/2022   ID:  Gwendolyn Sampson, DOB 11-27-1950, MRN [id number]  Patient Location: Home Provider Location: Office/Clinic  PCP:  Flossie Buffy, NP  Cardiologist:  Berniece Salines, DO  Electrophysiologist:  None   Evaluation Performed:  Follow-Up Visit  Chief Complaint:  " I am worried"  History of Present Illness:    Gwendolyn Sampson is a 71 y.o. female with hypertension, hypothyroidism, aortic atherosclerosis seen on CT scan, coronary calcification on coronary calcium scoring, proximal ascending aortic aneurysm most recent CT scan in June 2023 measuring 4.2 cm here for follow-up.  The patient called with questions about the proximal is ascending aortic aneurysm because her friend had advised her about some concerning information.  During this visit she was concerned about if she was going to consistently die from the aneurysm.  She also had questions about repeating testing in when surgery is indicated.  The patient does not have symptoms concerning for COVID-19 infection (fever, chills, cough, or new shortness of breath).    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 No typical  groundglass type peripheral infiltrates to suggest viral pneumonia 2. Peripheral opacities in all lobes of both lungs with an upper lobe predominance, presumed fibrosis given the history of interstitial lung disease. - Covid IgG   03/31/2019  Positive   - 04/19/2019   Walked RA  2 laps @  approx 254f each @ very fast pace  stopped due to   MRSA (methicillin resistant Staphylococcus aureus) 2010   wrist, waist, buttock   [place]    Past Surgical History:  Procedure Laterality Date   EYE SURGERY     cataract   LIPOMA EXCISION     on bacl   STRABISMUS SURGERY Bilateral 10/22/2017   Procedure: REPAIR STRABISMUS BILATERAL;  Surgeon: YEveritt Amber MD;  [place] [facility]  Service: Ophthalmology;  Laterality: Bilateral;   TONSILLECTOMY       Current Meds  Medication Sig   acetaminophen (TYLENOL) 325 MG tablet acetaminophen   aspirin EC 81 MG tablet Take 81 mg by mouth daily.   Biotin 1000 MCG CHEW Chew 1,000 mcg by mouth daily.   Cholecalciferol (VITAMIN D-3) 25 MCG (1000 UT) CAPS Take by mouth.   diclofenac Sodium (VOLTAREN) 1 % GEL Apply 2 g topically 4 (four) times daily.   Docosahexaenoic Acid (DHA NATURAL OMEGA-3 PO) Take by mouth.   Krill Oil (OMEGA-3) 500 MG CAPS Take 500 mg by mouth daily.   levothyroxine (SYNTHROID) 75 MCG tablet Take 1 tablet by mouth daily.   multivitamin-lutein (  OCUVITE-LUTEIN) CAPS capsule Take 1 capsule by mouth daily.   Nutritional Supplements (JUICE PLUS FIBRE PO) Take by mouth.   omeprazole (PRILOSEC) 20 MG capsule Take 1 capsule (20 mg total) by mouth daily.   oxymetazoline (AFRIN NASAL SPRAY) 0.05 % nasal spray Place 1 spray into right nostril 2 (two) times daily. Use only for 3days, then stop   propranolol (INDERAL) 20 MG tablet Take 20 mg by mouth daily.   pyridoxine (B-6) 100 MG tablet Take 100 mg by mouth daily.   rosuvastatin (CRESTOR) 10 MG tablet TAKE ONE TABLET BY MOUTH ONE TIME DAILY   saccharomyces  boulardii (FLORASTOR) 250 MG capsule Take 250 mg by mouth 2 (two) times daily.   sodium chloride (OCEAN) 0.65 % SOLN nasal spray Place 1 spray into both nostrils as needed for congestion.   Vitamin E 180 MG CAPS Take 180 mg by mouth daily.   [DISCONTINUED] levothyroxine (SYNTHROID, LEVOTHROID) 75 MCG tablet levothyroxine 75 mcg tablet  TAKE 1 TABLET BY MOUTH ONCE DAILY FOR 90 DAYS   [DISCONTINUED] propranolol ER (INDERAL LA) 60 MG 24 hr capsule Take 1 capsule by mouth daily.     Allergies:   Other   Social History   Tobacco Use   Smoking status: Never   Smokeless tobacco: Never  Vaping Use   Vaping Use: Never used  Substance Use Topics   Alcohol use: No   Drug use: No     Family Hx: The patient's family history includes Dementia in her mother; Diabetes in her mother; Heart disease in her father, paternal aunt, and paternal grandmother; Other in her mother; Pancreatic cancer in her father. There is no history of Esophageal cancer, Colon cancer, Rectal cancer, or Stomach cancer.  ROS:   Please see the history of present illness.     All other systems reviewed and are negative.   Prior CV studies:   The following studies were reviewed today: CTA chest 04/2022   FINDINGS: Cardiovascular: No aortic intramural hematoma. Scattered calcified atheromatous plaque of the descending thoracic aorta. Heart size within normal limits. Left anterior descending coronary artery calcifications are seen.   Ascending thoracic aortic aneurysm measures maximum of 4.2 cm.   Low-density seen in the region of the right lower lobe pulmonary artery (image 64, series 8 and image 69 of series 12) is likely motion artifact.   Mediastinum/Nodes: No enlarged mediastinal, hilar, or axillary lymph nodes. Thyroid gland, trachea, and esophagus demonstrate no significant findings.   Lungs/Pleura: Lungs are clear. No pleural effusion or pneumothorax.   Upper Abdomen: Small hiatal hernia.  No acute  abnormality.   Musculoskeletal: No chest wall abnormality. No acute or significant osseous findings.   Review of the MIP images confirms the above findings.   IMPRESSION: 1. Ascending thoracic aortic aneurysm measuring up to 4.2 cm. 2. Low-density seen in the region of the right lower lobe pulmonary artery (image 64, series 8 and image 69 of series 12) is likely motion artifact. If the patient has symptoms of pulmonary artery embolism, dedicated PE CTA of the chest should be performed.   These results will be called to the ordering clinician or representative by the Radiologist Assistant, and communication documented in the PACS or [employer].      Calcium score 09/2021 FINDINGS: Coronary arteries: Normal origins.   Coronary Calcium Score:   Left main: 0   Left anterior descending artery: 164   Left circumflex artery: 0   Right coronary artery: 0   Total:  164   Percentile: 82nd   Pericardium: Normal.   Ascending Aorta: Dilated ascending aorta measuring 41m  High-resolution CT done in April 2022 IMPRESSION:  1. No typical groundglass type peripheral infiltrates to suggest viral pneumonia  2. Peripheral opacities in all lobes of both lungs with an upper lobe predominance, presumed fibrosis given the history of interstitial lung disease. Strictly speaking, infectious infiltrates cannot be excluded without COMPARISON with old studies.  3. No adenopathy or effusion  4. Coronary and aortic ASVD   Electronically Signed by: SRuta HindsNarrative   COMPARISON: None.  INDICATION: Interstitial pulmonary disease, unspecified (#)  TECHNIQUE:  CT CHEST HIGH RESOLUTION -  Contrast:  Radiation dose reduction was utilized (automated exposure control, mA or kV adjustment based on patient size, or iterative image reconstruction).   Exam date/time: 03/09/2019 12:54 PM   FINDINGS: No COMPARISON studies. There are no typical groundglass type peripheral infiltrates to  suggest viral pneumonia. There are peripheral presumed fibrotic changes in all lobes of both lungs with an upper lobe predominance. Strictly speaking,  infectious infiltrates cannot be excluded without COMPARISON to old films. No adenopathy. Normal heart size. Coronary and aortic ASVD. No pleural or pericardial effusion. Negative upper abdomen. The trachea and mainstem bronchi are patent. Other Result Text   Acute Interface, Incoming Rad Results - 03/09/2019  3:18 PM EDT  COMPARISON: None.  INDICATION: Interstitial pulmonary disease, unspecified (#)  TECHNIQUE:  CT CHEST HIGH RESOLUTION -  Contrast:  Radiation dose reduction was utilized (automated exposure control, mA or kV adjustment based on patient size, or iterative image reconstruction).   Exam date/time: 03/09/2019 12:54 PM   FINDINGS: No COMPARISON studies. There are no typical groundglass type peripheral infiltrates to suggest viral pneumonia. There are peripheral presumed fibrotic changes in all lobes of both lungs with an upper lobe predominance. Strictly speaking,  infectious infiltrates cannot be excluded without COMPARISON to old films. No adenopathy. Normal heart size. Coronary and aortic ASVD. No pleural or pericardial effusion. Negative upper abdomen. The trachea and mainstem bronchi are patent.    IMPRESSION:  1. No typical groundglass type peripheral infiltrates to suggest viral pneumonia  2. Peripheral opacities in all lobes of both lungs with an upper lobe predominance, presumed fibrosis given the history of interstitial lung disease. Strictly speaking, infectious infiltrates cannot be excluded without COMPARISON with old studies.  3. No adenopathy or effusion  4. Coronary and aortic ASVD.  Labs/Other Tests and Data Reviewed:    EKG:  none today   Recent Labs: 12/11/2021: Hemoglobin 14.3; Platelets 201.0 01/22/2022: ALT 20; BUN 19; Creatinine, Ser 0.85; Potassium 4.6; Sodium 139   Recent Lipid Panel Lab Results   Component Value Date/Time   CHOL 154 01/22/2022 08:03 AM   TRIG 65.0 01/22/2022 08:03 AM   HDL 70.40 01/22/2022 08:03 AM   CHOLHDL 2 01/22/2022 08:03 AM   LDLCALC 70 01/22/2022 08:03 AM    Wt Readings from Last 3 Encounters:  05/06/22 170 lb (77.1 kg)  01/20/22 180 lb (81.6 kg)  12/11/21 188 lb 12.8 oz (85.6 kg)     Objective:    Vital Signs:  BP 117/76   Pulse (!) 59   Ht '5\' 5"'$  (1.651 m)   Wt 170 lb (77.1 kg)   BMI 28.29 kg/m      ASSESSMENT & PLAN:    Proximal Ascending Aneurysm Hypertension   We talk about her Chest CTA - plan to repeat imaging in 1 year.  Blood pressure is stable, no  med changes  COVID-19 Education: The signs and symptoms of COVID-19 were discussed with the patient and how to seek care for testing (follow up with PCP or arrange E-visit).  The importance of social distancing was discussed today.  Time:   Today, I have spent 15 minutes with the patient with telehealth technology discussing the above problems.     Medication Adjustments/Labs and Tests Ordered: Current medicines are reviewed at length with the patient today.  Concerns regarding medicines are outlined above.   Tests Ordered: Orders Placed This Encounter  Procedures   CT ANGIO CHEST AORTA W/CM & OR WO/CM   Basic metabolic panel    Medication Changes: No orders of the defined types were placed in this encounter.   Follow Up:  In Person in 1 year(s)  Signed, Berniece Salines, DO  05/06/2022 8:09 PM    [facility]

## 2022-07-07 ENCOUNTER — Ambulatory Visit
Admission: RE | Admit: 2022-07-07 | Discharge: 2022-07-07 | Disposition: A | Discharge status: Home / Self Care | Payer: Medicare Other | Source: Ambulatory Visit | Attending: Nurse Practitioner | Admitting: Nurse Practitioner

## 2022-07-07 DIAGNOSIS — M8589 Other specified disorders of bone density and structure, multiple sites: Secondary | ICD-10-CM

## 2022-09-11 LAB — HM MAMMOGRAPHY

## 2022-09-14 ENCOUNTER — Ambulatory Visit (INDEPENDENT_AMBULATORY_CARE_PROVIDER_SITE_OTHER): Payer: Medicare Other | Admitting: Nurse Practitioner

## 2022-09-14 ENCOUNTER — Encounter: Payer: Self-pay | Admitting: Nurse Practitioner

## 2022-09-14 ENCOUNTER — Ambulatory Visit
Admission: RE | Admit: 2022-09-14 | Discharge: 2022-09-14 | Disposition: A | Discharge status: Home / Self Care | Payer: Medicare Other | Source: Ambulatory Visit | Attending: Nurse Practitioner | Admitting: Nurse Practitioner

## 2022-09-14 VITALS — BP 126/80 | HR 71 | Temp 95.8°F | Ht 65.0 in | Wt 165.6 lb

## 2022-09-14 DIAGNOSIS — S060X9A Concussion with loss of consciousness of unspecified duration, initial encounter: Secondary | ICD-10-CM

## 2022-09-14 DIAGNOSIS — R42 Dizziness and giddiness: Secondary | ICD-10-CM

## 2022-09-14 DIAGNOSIS — S0093XA Contusion of unspecified part of head, initial encounter: Secondary | ICD-10-CM

## 2022-09-14 NOTE — Progress Notes (Signed)
Established Patient Visit  Patient: Gwendolyn Sampson   DOB: [birth year]   71 y.o. Female  MRN: [id number] Visit Date: 09/14/2022  Subjective:    Chief Complaint  Patient presents with   URI    Urgent care f/u Had a fall on 09/11/22    HPI Gwendolyn Sampson fell 3days ago in her home. This occurred at 3am. She woke up to the bathroom, she turned towards her stairs instead of her bathroom. She fell down 14stairs. She thinks she lost consciousness as she fell. She is not sure duration of LOC. She did not look at the time when she woke up at the bottom of the stairs. She is not sure if she hit her head or not. She was evaluated at [facility] urgent care clinic yesterday due to uncontrolled neck and right arm pain. Cervical spine, right shoulder and facial x-ray completed: no acute fracture.requested records.Toradol IM administered and flexeril prescribed.  Today she reports dizziness with flexeril dose yesterday. Dizziness with bending over and turning head too quickly. She had a missed fall yesterday evening after flexeril dose.  She denies any change in vision, no headache, no LOC since 09/11/22, no tinnitus today She reports persistent neck stiffness and right forearm pain (arm pain improves with elevation, no weakness).  Reviewed medical, surgical, and social history today  Medications: Outpatient Medications Prior to Visit  Medication Sig   aspirin EC 81 MG tablet Take 81 mg by mouth daily.   Biotin 1000 MCG CHEW Chew 1,000 mcg by mouth daily.   Cholecalciferol (VITAMIN D-3) 25 MCG (1000 UT) CAPS Take by mouth.   diclofenac Sodium (VOLTAREN) 1 % GEL Apply 2 g topically 4 (four) times daily.   Docosahexaenoic Acid (DHA NATURAL OMEGA-3 PO) Take by mouth.   ibuprofen (ADVIL) 600 MG tablet Take 600 mg by mouth every 6 (six) hours as needed.   Krill Oil (OMEGA-3) 500 MG CAPS Take 500 mg by mouth daily.   levothyroxine (SYNTHROID) 75 MCG tablet Take 1 tablet by mouth daily.    multivitamin-lutein (OCUVITE-LUTEIN) CAPS capsule Take 1 capsule by mouth daily.   Nutritional Supplements (JUICE PLUS FIBRE PO) Take by mouth.   propranolol (INDERAL) 20 MG tablet Take 20 mg by mouth daily.   pyridoxine (B-6) 100 MG tablet Take 100 mg by mouth daily.   rosuvastatin (CRESTOR) 10 MG tablet TAKE ONE TABLET BY MOUTH ONE TIME DAILY   Vitamin E 180 MG CAPS Take 180 mg by mouth daily.   [DISCONTINUED] acetaminophen (TYLENOL) 325 MG tablet acetaminophen (Patient not taking: Reported on 09/14/2022)   [DISCONTINUED] omeprazole (PRILOSEC) 20 MG capsule Take 1 capsule (20 mg total) by mouth daily. (Patient not taking: Reported on 09/14/2022)   [DISCONTINUED] oxymetazoline (AFRIN NASAL SPRAY) 0.05 % nasal spray Place 1 spray into right nostril 2 (two) times daily. Use only for 3days, then stop (Patient not taking: Reported on 09/14/2022)   [DISCONTINUED] saccharomyces boulardii (FLORASTOR) 250 MG capsule Take 250 mg by mouth 2 (two) times daily. (Patient not taking: Reported on 09/14/2022)   [DISCONTINUED] sodium chloride (OCEAN) 0.65 % SOLN nasal spray Place 1 spray into both nostrils as needed for congestion. (Patient not taking: Reported on 09/14/2022)   No facility-administered medications prior to visit.   Reviewed past medical and social history.   ROS per HPI above      Objective:  BP 126/80 (BP Location: Right Arm, Patient Position: Sitting, Cuff  Size: Normal)   Pulse 71   Temp (!) 95.8 F (35.4 C) (Temporal)   Ht '5\' 5"'$  (1.651 m)   Wt 165 lb 9.6 oz (75.1 kg)   SpO2 97%   BMI 27.56 kg/m      Physical Exam HENT:     Head: Contusion present.     Jaw: There is normal jaw occlusion.     Right Ear: External ear normal.     Left Ear: External ear normal.     Nose: Nose normal.  Eyes:     General: No visual field deficit or scleral icterus.    Extraocular Movements:     Right eye: Normal extraocular motion and no nystagmus.     Left eye: Normal extraocular motion and  no nystagmus.     Conjunctiva/sclera: Conjunctivae normal.     Comments: Bruising around left eye,  Hematoma on left temporal and preauricular region.  Cardiovascular:     Rate and Rhythm: Normal rate and regular rhythm.     Pulses: Normal pulses.     Heart sounds: Normal heart sounds.  Pulmonary:     Effort: Pulmonary effort is normal.     Breath sounds: Normal breath sounds.  Chest:     Chest wall: No tenderness or crepitus.  Musculoskeletal:        General: No swelling.     Right shoulder: Normal.     Left shoulder: Normal.     Right upper arm: Normal.     Left upper arm: Normal.     Right elbow: No swelling, deformity or effusion. Normal range of motion. Tenderness present in lateral epicondyle. No radial head, medial epicondyle or olecranon process tenderness.     Right forearm: Tenderness present. No bony tenderness.     Right wrist: Normal.     Right hand: Normal.     Cervical back: Signs of trauma and tenderness present. No torticollis or crepitus. Pain with movement present.     Thoracic back: Normal.     Lumbar back: Normal.     Right hip: Normal.     Left hip: Normal.     Right upper leg: Normal.     Left upper leg: Normal.     Right knee: Normal.     Left knee: Normal.     Right lower leg: Normal.     Left lower leg: Normal.  Skin:    Findings: Bruising present.  Neurological:     Mental Status: She is oriented to person, place, and time.     Cranial Nerves: No cranial nerve deficit.     Motor: No weakness.     Coordination: Coordination is intact.     Gait: Gait normal.     No results found for any visits on 09/14/22.    Assessment & Plan:    Problem List Items Addressed This Visit   None Visit Diagnoses     Concussion with loss of consciousness, initial encounter    -  Primary   Relevant Orders   CT HEAD WO CONTRAST (5MM)   Traumatic hematoma of head, initial encounter       Relevant Orders   CT HEAD WO CONTRAST (5MM)   Dizziness         Sign  medical release to get records from [facility] urgent care. She declined tramadol rx at this time due to fear of side effects Use tylenol '650mg'$  every 8hrs for pain. Stop flexeril Call office if you change your mind  about tramadol prescription You will be contacted to schedule appt for head CT  Return in about 1 week (around 09/21/2022) for concussion.     Wilfred Lacy, NP

## 2022-09-14 NOTE — Patient Instructions (Signed)
Sign medical release to get records from [facility] urgent care. Use tylenol '650mg'$  every 8hrs for pain. Stop flexeril Call office if you change your mind about tramadol prescription You will be contacted to schedule appt for head CT  Concussion, Adult  A concussion is a brain injury from a hard, direct hit (trauma) to the head or body. This direct hit causes the brain to shake quickly back and forth inside the skull. This can damage brain cells and cause chemical changes in the brain. A concussion may also be known as a mild traumatic brain injury (TBI). Concussions are usually not life-threatening, but the effects of a concussion can be serious. If you have a concussion, you should be very careful to avoid having a second concussion. What are the causes? This condition is caused by: A direct hit to your head, such as: Running into another player during a game. Being hit in a fight. Hitting your head on a hard surface. Sudden movement of your body that causes your brain to move back and forth inside the skull, such as in a car crash. What are the signs or symptoms? The signs of a concussion can be hard to notice. Early on, they may be missed by you, family members, and health care providers. You may look fine on the outside but may act or feel differently. Every head injury is different. Symptoms are usually temporary but may last for days, weeks, or even months. Some symptoms appear right away, but other symptoms may not show up for hours or days. If your symptoms last longer than normal, you may have post-concussion syndrome. Physical symptoms Headaches. Dizziness and problems with coordination or balance. Sensitivity to light or noise. Nausea or vomiting. Tiredness (fatigue). Vision or hearing problems. Changes in eating or sleeping patterns. Seizure. Mental and emotional symptoms Irritability or mood changes. Memory problems. Trouble concentrating, organizing, or making  decisions. Slowness in thinking, acting or reacting, speaking, or reading. Anxiety or depression. How is this diagnosed? This condition is diagnosed based on: Your symptoms. A description of your injury. You may also have tests, including: Imaging tests, such as a CT scan or an MRI. Neuropsychological tests. These measure your thinking, understanding, learning, and remembering abilities. How is this treated? Treatment for this condition includes: Stopping sports or activity if you are injured. If you hit your head or show signs of concussion: Do not return to sports or activities the same day. Get checked by a health care provider before you return to your activities. Physical and mental rest and careful observation, usually at home. Gradually return to your normal activities. Medicines to help with symptoms such as headaches, nausea, or difficulty sleeping. Avoid taking opioid pain medicine while recovering from a concussion. Avoiding alcohol and drugs. These may slow your recovery and can put you at risk of further injury. Referral to a concussion clinic or rehabilitation center. Recovery from a concussion can take time. How fast you recover depends on many factors. Return to activities only when: Your symptoms are completely gone. Your health care provider says that it is safe. Follow these instructions at home: Activity Limit activities that require a lot of thought or concentration, such as: Doing homework or job-related work. Watching TV. Working on the computer or phone. Playing memory games and puzzles. Rest. Rest helps your brain heal. Make sure you: Get plenty of sleep. Most adults should get 7-9 hours of sleep each night. Rest during the day. Take naps or rest breaks when you  feel tired. Avoid physical activity like exercise until your health care provider says it is safe. Stop any activity that worsens symptoms. Do not do high-risk activities that could cause a second  concussion, such as riding a bike or playing sports. Ask your health care provider when you can return to your normal activities, such as school, work, athletics, and driving. Your ability to react may be slower after a brain injury. Never do these activities if you are dizzy. Your health care provider will likely give you a plan for gradually returning to activities. General instructions  Take over-the-counter and prescription medicines only as told by your health care provider. Some medicines, such as blood thinners (anticoagulants) and aspirin, may increase the risk for complications, such as bleeding. Do not drink alcohol until your health care provider says you can. Watch your symptoms and tell others around you to do the same. Complications sometimes occur after a concussion. Older adults with a brain injury may have a higher risk of serious complications. Tell your work Freight forwarder, teachers, Government social research officer, school counselor, coach, or Product/process development scientist about your injury, symptoms, and restrictions. Keep all follow-up visits as told by your health care provider. This is important. How is this prevented? Avoiding another brain injury is very important. In rare cases, another injury can lead to permanent brain damage, brain swelling, or death. The risk of this is greatest during the first 7-10 days after a head injury. Avoid injuries by: Stopping activities that could lead to a second concussion, such as contact or recreational sports, until your health care provider says it is okay. Taking these actions once you have returned to sports or activities: Avoiding plays or moves that can cause you to crash into another person. This is how most concussions occur. Following the rules and being respectful of other players. Do not engage in violent or illegal plays. Getting regular exercise that includes strength and balance training. Wearing a properly fitting helmet during sports, biking, or other  activities. Helmets can help protect you from serious skull and brain injuries, but they may not protect you from a concussion. Even when wearing a helmet, you should avoid being hit in the head. Contact a health care provider if: Your symptoms do not improve. You have new symptoms. You have another injury. Get help right away if: You have new or worsening physical symptoms, such as: A severe or worsening headache. Weakness or numbness in any part of your body, slurred speech, vision changes, or confusion. Your coordination gets worse. Vomiting repeatedly. You have a seizure. You have unusual behavior changes. You lose consciousness, are sleepier than normal, or are difficult to wake up. These symptoms may represent a serious problem that is an emergency. Do not wait to see if the symptoms will go away. Get medical help right away. Call your local emergency services (911 in the [place] Do not drive yourself to the hospital. Summary A concussion is a brain injury that results from a hard, direct hit (trauma) to your head or body. You may have imaging tests and neuropsychological tests to diagnose a concussion. Treatment for this condition includes physical and mental rest and careful observation. Ask your health care provider when you can return to your normal activities, such as school, work, athletics, and driving. Get help right away if you have a severe headache, weakness in any part of the body, seizures, behavior changes, changes in vision, or if you are confused or sleepier than normal. This information is not  intended to replace advice given to you by your health care provider. Make sure you discuss any questions you have with your health care provider. Document Revised: 01/23/2021 Document Reviewed: 01/23/2021 Elsevier Patient Education  [place].

## 2022-09-22 ENCOUNTER — Encounter: Payer: Self-pay | Admitting: Nurse Practitioner

## 2022-09-22 ENCOUNTER — Ambulatory Visit (INDEPENDENT_AMBULATORY_CARE_PROVIDER_SITE_OTHER): Payer: Medicare Other | Admitting: Nurse Practitioner

## 2022-09-22 VITALS — BP 126/80 | HR 77 | Temp 97.4°F | Ht 65.0 in | Wt 162.6 lb

## 2022-09-22 DIAGNOSIS — S060X9D Concussion with loss of consciousness of unspecified duration, subsequent encounter: Secondary | ICD-10-CM | POA: Diagnosis not present

## 2022-09-22 NOTE — Patient Instructions (Signed)
Maintain upcoming appt  Concussion, Adult  A concussion is a brain injury from a hard, direct hit (trauma) to the head or body. This direct hit causes the brain to shake quickly back and forth inside the skull. This can damage brain cells and cause chemical changes in the brain. A concussion may also be known as a mild traumatic brain injury (TBI). The effects of a concussion can be serious. If you have a concussion, you should be very careful to avoid having a second concussion. What are the causes? This condition is caused by: A direct hit to your head. Sudden movement of your body that causes your brain to move back and forth inside the skull, such as in a car crash. What are the signs or symptoms? The signs of a concussion can be hard to notice. Early on, they may be missed by you, family members, and health care providers. You may look fine on the outside but may act or feel differently. Every head injury is different. Symptoms are usually temporary but may last for days, weeks, or even months. Some symptoms appear right away, but other symptoms may not show up for hours or days. Physical symptoms Headaches. Dizziness and problems with coordination or balance. Sensitivity to light or noise. Nausea or vomiting. Tiredness (fatigue). Vision or hearing problems. Seizure. Mental and emotional symptoms Irritability or mood changes. Memory problems. Trouble concentrating, organizing, or making decisions. Changes in eating or sleeping patterns. Slowness in thinking, acting or reacting, speaking, or reading. Anxiety or depression. How is this diagnosed? This condition is diagnosed based on your symptoms and injury. You may also have tests, including: Imaging tests, such as a CT scan or an MRI. Neuropsychological tests. These measure your thinking, understanding, learning, and memory. How is this treated? Treatment for this condition includes: Stopping sports or activity if you are  injured. Physical and mental rest and careful observation, usually at home. Medicines to help with symptoms such as headaches, nausea, or difficulty sleeping. Referral to a concussion clinic or rehab center. Follow these instructions at home: Activity Limit activities that require a lot of thought or concentration, such as: Doing homework or job-related work. Watching TV. Using the computer or phone. Playing memory games and doing puzzles. Rest helps your brain heal. Make sure you: Get plenty of sleep. Most adults should get 7-9 hours of sleep each night. Rest during the day. Take naps or rest breaks when you feel tired. Avoid high-intensity exercise or physical activities that take a lot of effort. Stop any activity that worsens symptoms. Your health care provider may recommend light exercise such as walking. Do not do high-risk activities that could cause a second concussion, such as riding a bike or playing sports. Ask your health care provider when you can return to your normal activities, such as school, work, sports, and driving. Your ability to react may be slower after a brain injury. Never do these activities if you are dizzy. General instructions  Take over-the-counter and prescription medicines only as told by your health care provider. Some medicines, such as blood thinners (anticoagulants) and aspirin, may increase the risk for complications, such as bleeding. Avoid taking opioid pain medicine while recovering from a concussion. Do not drink alcohol until your health care provider says you can. Drinking alcohol may slow your recovery and can put you at risk of further injury. Watch your symptoms and tell others around you to do the same. Complications sometimes occur after a concussion. Tell your work  Freight forwarder, teachers, school nurse, school counselor, coach, or sports trainer about your injury, symptoms, and restrictions. See a mental health therapist if you feel anxious or  depressed. Managing this condition can be challenging. Keep all follow-up visits. Your health care provider will check on your recovery and give you a plan for returning to activities. How is this prevented? Avoiding another brain injury is very important. In rare cases, another injury can lead to permanent brain damage, brain swelling, or death. The risk of this is greatest during the first 7-10 days after a head injury. Avoid injuries by: Stopping activities that could lead to a second concussion, such as contact or recreational sports, until your health care provider says it is okay. Taking these actions once you have returned to sports or activities: Avoid plays or moves that can cause you to crash into another person. This is how most concussions occur. Follow the rules and be respectful of other players. Do not engage in violent or illegal plays. Getting regular exercise that includes strength and balance training. Wearing a properly fitting helmet during sports, biking, or other activities. Helmets can help protect you from serious skull and brain injuries, but they may not protect you from a concussion. Even when wearing a helmet, you should avoid being hit in the head. Where to find more information Centers for Disease Control and Prevention: [employer] Contact a health care provider if: Your symptoms do not improve or get worse. You have new symptoms. You have another injury. Your coordination gets worse. You have unusual behavior changes. Get help right away if: You have a severe or worsening headache. You have weakness or numbness in any part of your body, slurred speech, vision changes, or confusion. You vomit repeatedly. You lose consciousness, are sleepier than normal, or are difficult to wake up. You have a seizure. These symptoms may be an emergency. Get help right away. Call 911. Do not wait to see if the symptoms will go away. Do not drive yourself to the hospital. Also,  get help right away if: You have thoughts of hurting yourself or others. Take one of these steps if you feel like you may hurt yourself or others, or have thoughts about taking your own life: Go to your nearest emergency room. Call 911. Call the [place] at [phone] or 988. This is open 24 hours a day. Text the Crisis Text Line at 7631019926. This information is not intended to replace advice given to you by your health care provider. Make sure you discuss any questions you have with your health care provider. Document Revised: 04/03/2022 Document Reviewed: 04/03/2022 Elsevier Patient Education  [place].

## 2022-09-22 NOTE — Progress Notes (Signed)
Established Patient Visit  Patient: Gwendolyn Sampson   DOB: 07-05-1951   71 y.o. Female  MRN: [id number] Visit Date: 09/22/2022  Subjective:    Chief Complaint  Patient presents with   Office Visit    Concussion f/u  Says things are getting better but still has some dizziness & left rib pain     HPI Ms. Harlie is here for concussion f/up. She denies any new symptoms, resolved arm pain, and improved but intermittent dizziness when she bends forward. She denies any LOC or near falls.  Reviewed medical, surgical, and social history today  Medications: Outpatient Medications Prior to Visit  Medication Sig   aspirin EC 81 MG tablet Take 81 mg by mouth daily.   Biotin 1000 MCG CHEW Chew 1,000 mcg by mouth daily.   Cholecalciferol (VITAMIN D-3) 25 MCG (1000 UT) CAPS Take by mouth.   diclofenac Sodium (VOLTAREN) 1 % GEL Apply 2 g topically 4 (four) times daily.   Docosahexaenoic Acid (DHA NATURAL OMEGA-3 PO) Take by mouth.   ibuprofen (ADVIL) 600 MG tablet Take 600 mg by mouth every 6 (six) hours as needed.   Krill Oil (OMEGA-3) 500 MG CAPS Take 500 mg by mouth daily.   levothyroxine (SYNTHROID) 75 MCG tablet Take 1 tablet by mouth daily.   multivitamin-lutein (OCUVITE-LUTEIN) CAPS capsule Take 1 capsule by mouth daily.   Nutritional Supplements (JUICE PLUS FIBRE PO) Take by mouth.   propranolol (INDERAL) 20 MG tablet Take 20 mg by mouth daily.   pyridoxine (B-6) 100 MG tablet Take 100 mg by mouth daily.   rosuvastatin (CRESTOR) 10 MG tablet TAKE ONE TABLET BY MOUTH ONE TIME DAILY   Vitamin E 180 MG CAPS Take 180 mg by mouth daily.   No facility-administered medications prior to visit.   Reviewed past medical and social history.   ROS per HPI above      Objective:  BP 126/80 (BP Location: Right Arm, Patient Position: Sitting, Cuff Size: Normal)   Pulse 77   Temp (!) 97.4 F (36.3 C) (Temporal)   Ht '5\' 5"'$  (1.651 m)   Wt 162 lb 9.6 oz (73.8 kg)   SpO2 98%   BMI  27.06 kg/m      Physical Exam Vitals reviewed.  HENT:     Right Ear: Tympanic membrane, ear canal and external ear normal.     Left Ear: Tympanic membrane, ear canal and external ear normal.  Cardiovascular:     Rate and Rhythm: Normal rate and regular rhythm.     Pulses: Normal pulses.     Heart sounds: Normal heart sounds.  Pulmonary:     Effort: Pulmonary effort is normal.     Breath sounds: Normal breath sounds.  Musculoskeletal:        General: Normal range of motion.     Cervical back: Normal range of motion and neck supple.  Skin:    Findings: Bruising present.     Comments: Healing hematoma on left side of face, periorbital and head  Neurological:     Mental Status: She is alert and oriented to person, place, and time.     Cranial Nerves: No cranial nerve deficit.  Psychiatric:        Mood and Affect: Mood normal.        Behavior: Behavior normal.        Thought Content: Thought content normal.     No  results found for any visits on 09/22/22.    Assessment & Plan:    Problem List Items Addressed This Visit   None Visit Diagnoses     Concussion with loss of consciousness, subsequent encounter    -  Primary     Will f/up via phone call in 1week. If symptoms persist, will need MRI brain. Advised to continue fall precautions and rest.  Return if symptoms worsen or fail to improve.     Wilfred Lacy, NP

## 2022-09-28 ENCOUNTER — Telehealth: Payer: Self-pay

## 2022-09-28 DIAGNOSIS — S060X9D Concussion with loss of consciousness of unspecified duration, subsequent encounter: Secondary | ICD-10-CM

## 2022-09-28 DIAGNOSIS — R42 Dizziness and giddiness: Secondary | ICD-10-CM

## 2022-09-28 NOTE — Telephone Encounter (Signed)
-----   Message from Flossie Buffy, NP sent at 09/22/2022  8:36 AM EDT ----- Regarding: concussion f/up Ask How she is feeling? Has dizziness resolved or improved or worse or same? Does she have any new symptoms?  Wilfred Lacy, NP

## 2022-09-28 NOTE — Telephone Encounter (Signed)
Pt advised.

## 2022-09-28 NOTE — Addendum Note (Signed)
Addended by: Leana Gamer on: 09/28/2022 09:48 AM   Modules accepted: Orders

## 2022-09-28 NOTE — Telephone Encounter (Signed)
Left VM, adv pt to call back

## 2022-09-28 NOTE — Telephone Encounter (Signed)
Called & spoke w/ pt, says she is better but still has some dizziness when she moves her head a certain way. No new symptoms.

## 2022-09-29 ENCOUNTER — Ambulatory Visit: Payer: Medicare Other | Admitting: Nurse Practitioner

## 2022-09-30 ENCOUNTER — Encounter: Payer: Self-pay | Admitting: Nurse Practitioner

## 2022-10-20 ENCOUNTER — Ambulatory Visit
Admission: RE | Admit: 2022-10-20 | Discharge: 2022-10-20 | Disposition: A | Discharge status: Home / Self Care | Payer: Medicare Other | Source: Ambulatory Visit | Attending: Nurse Practitioner | Admitting: Nurse Practitioner

## 2022-10-20 DIAGNOSIS — S060X9D Concussion with loss of consciousness of unspecified duration, subsequent encounter: Secondary | ICD-10-CM

## 2022-10-20 DIAGNOSIS — R42 Dizziness and giddiness: Secondary | ICD-10-CM

## 2022-10-21 ENCOUNTER — Encounter: Payer: Self-pay | Admitting: Nurse Practitioner

## 2022-10-21 DIAGNOSIS — H8303 Labyrinthitis, bilateral: Secondary | ICD-10-CM

## 2022-11-10 ENCOUNTER — Ambulatory Visit: Payer: Medicare Other | Admitting: Dermatology

## 2022-11-10 ENCOUNTER — Ambulatory Visit (INDEPENDENT_AMBULATORY_CARE_PROVIDER_SITE_OTHER): Payer: Medicare Other

## 2022-11-10 VITALS — Ht 65.0 in | Wt 163.0 lb

## 2022-11-10 DIAGNOSIS — Z0000 Encounter for general adult medical examination without abnormal findings: Secondary | ICD-10-CM | POA: Diagnosis not present

## 2022-11-10 NOTE — Progress Notes (Signed)
I connected with Gwendolyn Sampson today by telephone and verified that I am speaking with the correct person using two identifiers. Location patient: home Location provider: work Persons participating in the virtual visit: Gwendolyn Sampson, [employer] LPN.   I discussed the limitations, risks, security and privacy concerns of performing an evaluation and management service by telephone and the availability of in person appointments. I also discussed with the patient that there may be a patient responsible charge related to this service. The patient expressed understanding and verbally consented to this telephonic visit.    Interactive audio and video telecommunications were attempted between this provider and patient, however failed, due to patient having technical difficulties OR patient did not have access to video capability.  We continued and completed visit with audio only.     Vital signs may be patient reported or missing.  Subjective:   Gwendolyn Sampson is a 71 y.o. female who presents for Medicare Annual (Subsequent) preventive examination.  Review of Systems     Cardiac Risk Factors include: advanced age (>59mn, >>48women);dyslipidemia;hypertension     Objective:    Today's Vitals   11/10/22 0926  Weight: 163 lb (73.9 kg)  Height: '5\' 5"'$  (1.651 m)   Body mass index is 27.12 kg/m.     11/10/2022    9:30 AM 11/05/2021    9:12 AM 10/22/2017    7:32 AM 10/20/2017   10:12 AM  Advanced Directives  Does Patient Have a Medical Advance Directive? Yes Yes Yes Yes  Type of [place] Living will Living will  Does patient want to make changes to medical advance directive?   No - Patient declined   Copy of [place] Chart? Yes - validated most recent copy scanned in chart (See row information) No - copy requested      Current Medications (verified) Outpatient  Encounter Medications as of 11/10/2022  Medication Sig   aspirin EC 81 MG tablet Take 81 mg by mouth daily.   Biotin 1000 MCG CHEW Chew 1,000 mcg by mouth daily.   Cholecalciferol (VITAMIN D-3) 25 MCG (1000 UT) CAPS Take by mouth.   diclofenac Sodium (VOLTAREN) 1 % GEL Apply 2 g topically 4 (four) times daily.   Docosahexaenoic Acid (DHA NATURAL OMEGA-3 PO) Take by mouth.   ibuprofen (ADVIL) 600 MG tablet Take 600 mg by mouth every 6 (six) hours as needed.   Krill Oil (OMEGA-3) 500 MG CAPS Take 500 mg by mouth daily.   levothyroxine (SYNTHROID) 75 MCG tablet Take 1 tablet by mouth daily.   multivitamin-lutein (OCUVITE-LUTEIN) CAPS capsule Take 1 capsule by mouth daily.   Nutritional Supplements (JUICE PLUS FIBRE PO) Take by mouth.   propranolol (INDERAL) 20 MG tablet Take 20 mg by mouth daily.   rosuvastatin (CRESTOR) 10 MG tablet TAKE ONE TABLET BY MOUTH ONE TIME DAILY   Vitamin E 180 MG CAPS Take 180 mg by mouth daily.   pyridoxine (B-6) 100 MG tablet Take 100 mg by mouth daily. (Patient not taking: Reported on 11/10/2022)   No facility-administered encounter medications on file as of 11/10/2022.    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 No typical groundglass type peripheral infiltrates to suggest viral pneumonia 2. Peripheral opacities in all lobes of both lungs with an upper lobe predominance, presumed fibrosis given the history of interstitial lung disease. - Covid IgG   03/31/2019  Positive   - 04/19/2019   Walked RA  2 laps @  approx 244f each @ very fast pace  stopped due to   MRSA  (methicillin resistant Staphylococcus aureus) 2010   wrist, waist, buttock   Neuromuscular disorder ([place]    Past Surgical History:  Procedure Laterality Date   EYE SURGERY     cataract   LIPOMA EXCISION     on bacl   STRABISMUS SURGERY Bilateral 10/22/2017   Procedure: REPAIR STRABISMUS BILATERAL;  Surgeon: YEveritt Amber MD;  [facility]  Service: Ophthalmology;  Laterality: Bilateral;   TONSILLECTOMY     Family History  Problem Relation Age of Onset   Other Mother        essesntial tremors   Diabetes Mother    Dementia Mother    Pancreatic cancer Father    Heart disease Father    Heart disease Paternal Aunt    Heart disease Paternal Grandmother    Esophageal cancer Neg Hx    Colon cancer Neg Hx    Rectal cancer Neg Hx    Stomach cancer Neg Hx    Social History   Socioeconomic History   Marital status: Divorced    Spouse name: Not on file   Number of children: Not on file   Years of education: Not on file   Highest education level: Not on file  Occupational History   Not on file  Tobacco Use   Smoking status: Never   Smokeless tobacco: Never  Vaping Use   Vaping Use: Never used  Substance and Sexual Activity   Alcohol use: No   Drug use: No   Sexual activity: Not Currently    Birth control/protection: Post-menopausal  Other Topics Concern   Not on file  Social History Narrative   Not on file   Social Determinants of Health   Financial Resource Strain: Low Risk  (11/10/2022)   Overall Financial Resource Strain (CARDIA)    Difficulty of Paying Living Expenses: Not hard at all  Food Insecurity: No Food Insecurity (11/10/2022)   Hunger Vital Sign    Worried About Running Out of Food in the Last Year: Never true    Ran Out of Food in the Last Year: Never true  Transportation Needs: No Transportation Needs (11/10/2022)   PRAPARE - THydrologist(Medical): No    Lack of Transportation (Non-Medical): No   Physical Activity: Insufficiently Active (11/10/2022)   Exercise Vital Sign    Days of Exercise per Week: 7 days    Minutes of Exercise per Session: 20 min  Stress: No Stress Concern Present (11/10/2022)   [place]   Feeling of Stress : Not at all  Social Connections: Socially Isolated (11/05/2021)   Social Connection and Isolation Panel [NHANES]    Frequency of Communication with Friends and Family: Twice a week    Frequency of Social Gatherings with Friends and Family: Twice a week    Attends Religious Services: Never  Active Member of Clubs or Organizations: No    Attends Archivist Meetings: Never    Marital Status: Divorced    Tobacco Counseling Counseling given: Not Answered   Clinical Intake:  Pre-visit preparation completed: Yes  Pain : No/denies pain     Nutritional Status: BMI 25 -29 Overweight Nutritional Risks: None Diabetes: No  How often do you need to have someone help you when you read instructions, pamphlets, or other written materials from your doctor or pharmacy?: 1 - Never  Diabetic? no  Interpreter Needed?: No  Information entered by :: NAllen LPN   Activities of Daily Living    11/10/2022    9:34 AM  In your present state of health, do you have any difficulty performing the following activities:  Hearing? 0  Vision? 0  Difficulty concentrating or making decisions? 0  Walking or climbing stairs? 0  Dressing or bathing? 0  Doing errands, shopping? 0  Preparing Food and eating ? N  Using the Toilet? N  In the past six months, have you accidently leaked urine? N  Do you have problems with loss of bowel control? N  Managing your Medications? N  Managing your Finances? N  Housekeeping or managing your Housekeeping? N    Patient Care Team: Nche, Charlene Brooke, NP as PCP - General (Internal Medicine) Berniece Salines, DO as PCP - Cardiology (Cardiology) Ob/Gyn,  [facility] any recent Medical Services you may have received from other than [facility] providers in the past year (date may be approximate).     Assessment:   This is a routine wellness examination for Evadene.  Hearing/Vision screen Vision Screening - Comments:: Regular eye exams, Dr. Andria Frames  Dietary issues and exercise activities discussed: Current Exercise Habits: Home exercise routine, Type of exercise: Other - see comments (stationary bike), Time (Minutes): 20, Frequency (Times/Week): 7, Weekly Exercise (Minutes/Week): 140   Goals Addressed             This Visit's Progress    Patient Stated       11/10/2022, wants to lose weight       Depression Screen    11/10/2022    9:33 AM 11/05/2021    9:13 AM 11/05/2021    9:10 AM 08/08/2021   10:12 AM  PHQ 2/9 Scores  PHQ - 2 Score 0 0 0 0  PHQ- 9 Score    2    Fall Risk    11/10/2022    9:31 AM 09/14/2022   10:40 AM 11/05/2021    9:13 AM  Fall Risk   Falls in the past year? 1 1 0  Comment fell down the stairs    Number falls in past yr: 0 0 0  Injury with Fall? 1 1 0  Risk for fall due to : Medication side effect    Follow up Falls evaluation completed;Education provided;Falls prevention discussed  Falls evaluation completed    FALL RISK PREVENTION PERTAINING TO THE HOME:  Any stairs in or around the home? Yes  If so, are there any without handrails? No  Home free of loose throw rugs in walkways, pet beds, electrical cords, etc? Yes  Adequate lighting in your home to reduce risk of falls? Yes   ASSISTIVE DEVICES UTILIZED TO PREVENT FALLS:  Life alert? No  Use of a cane, walker or w/c? No  Grab bars in the bathroom? Yes  Shower chair or bench in shower? Yes  Elevated toilet seat or a handicapped toilet?  No   TIMED UP AND GO:  Was the test performed? No .      Cognitive Function:        11/10/2022    9:35 AM  6CIT Screen  What Year? 0 points  What month? 0 points  What time? 0 points   Count back from 20 0 points  Months in reverse 4 points  Repeat phrase 2 points  Total Score 6 points    Immunizations Immunization History  Administered Date(s) Administered   Fluad Quad(high Dose 65+) 08/15/2019, 08/09/2020   Influenza,inj,Quad PF,6+ Mos 08/23/2017   Influenza-Unspecified 09/22/2014, 08/23/2016, 08/23/2017, 08/23/2018, 08/24/2019, 08/13/2020, 08/05/2021   PFIZER(Purple Top)SARS-COV-2 Vaccination 01/21/2020, 02/05/2020, 09/23/2020, 08/05/2021   Pneumococcal Conjugate-13 03/24/2019   Pneumococcal Polysaccharide-23 02/05/2021   Tdap 03/21/2021   Zoster Recombinat (Shingrix) 03/24/2019, 08/17/2019    TDAP status: Up to date  Flu Vaccine status: Up to date  Pneumococcal vaccine status: Up to date  Covid-19 vaccine status: Completed vaccines  Qualifies for Shingles Vaccine? Yes   Zostavax completed Yes   Shingrix Completed?: Yes  Screening Tests Health Maintenance  Topic Date Due   Hepatitis C Screening  Never done   COVID-19 Vaccine (5 - 2023-24 season) 07/24/2022   Medicare Annual Wellness (AWV)  11/05/2022   INFLUENZA VACCINE  02/21/2023 (Originally 06/23/2022)   MAMMOGRAM  09/12/2023   COLONOSCOPY (Pts 45-64yr Insurance coverage will need to be confirmed)  09/20/2023   DTaP/Tdap/Td (2 - Td or Tdap) 03/22/2031   Pneumonia Vaccine 69 Years old  Completed   DEXA SCAN  Completed   Zoster Vaccines- Shingrix  Completed   HPV VACCINES  Aged Out    Health Maintenance  Health Maintenance Due  Topic Date Due   Hepatitis C Screening  Never done   COVID-19 Vaccine (5 - 2023-24 season) 07/24/2022   Medicare Annual Wellness (AWV)  11/05/2022    Colorectal cancer screening: Type of screening: Colonoscopy. Completed 09/19/2018. Repeat every 5 years  Mammogram status: Completed 09/11/2022. Repeat every year  Bone Density status: Completed 07/07/2022.  Lung Cancer Screening: (Low Dose CT Chest recommended if Age 69-80years, 30 pack-year currently smoking  OR have quit w/in 15years.) does not qualify.   Lung Cancer Screening Referral: no  Additional Screening:  Hepatitis C Screening: does qualify;   Vision Screening: Recommended annual ophthalmology exams for early detection of glaucoma and other disorders of the eye. Is the patient up to date with their annual eye exam?  Yes  Who is the provider or what is the name of the office in which the patient attends annual eye exams? Dr. HAndria FramesIf pt is not established with a provider, would they like to be referred to a provider to establish care? No .   Dental Screening: Recommended annual dental exams for proper oral hygiene  Community Resource Referral / Chronic Care Management: CRR required this visit?  No   CCM required this visit?  No      Plan:     I have personally reviewed and noted the following in the patient's chart:   Medical and social history Use of alcohol, tobacco or illicit drugs  Current medications and supplements including opioid prescriptions. Patient is not currently taking opioid prescriptions. Functional ability and status Nutritional status Physical activity Advanced directives List of other physicians Hospitalizations, surgeries, and ER visits in previous 12 months Vitals Screenings to include cognitive, depression, and falls Referrals and appointments  In addition, I have reviewed and discussed with patient certain preventive  protocols, quality metrics, and best practice recommendations. A written personalized care plan for preventive services as well as general preventive health recommendations were provided to patient.     Kellie Simmering, LPN   [id number]   Nurse Notes: none  Due to this being a virtual visit, the after visit summary with patients personalized plan was offered to patient via mail or my-chart.  Patient would like to access on my-chart

## 2022-11-10 NOTE — Patient Instructions (Addendum)
Gwendolyn Sampson , Thank you for taking time to come for your Medicare Wellness Visit. I appreciate your ongoing commitment to your health goals. Please review the following plan we discussed and let me know if I can assist you in the future.   These are the goals we discussed:  Goals      Patient Stated     11/10/2022, wants to lose weight        This is a list of the screening recommended for you and due dates:  Health Maintenance  Topic Date Due   Hepatitis C Screening: USPSTF Recommendation to screen - Ages 74-79 yo.  Never done   COVID-19 Vaccine (6 - 2023-24 season) 10/05/2022   Flu Shot  02/21/2023*   Mammogram  09/12/2023   Colon Cancer Screening  09/20/2023   Medicare Annual Wellness Visit  11/11/2023   DTaP/Tdap/Td vaccine (2 - Td or Tdap) 03/22/2031   Pneumonia Vaccine  Completed   DEXA scan (bone density measurement)  Completed   Zoster (Shingles) Vaccine  Completed   HPV Vaccine  Aged Out  *Topic was postponed. The date shown is not the original due date.    Advanced directives: copy in chart  Conditions/risks identified: none  Next appointment: Follow up in one year for your annual wellness visit    Preventive Care 65 Years and Older, Female Preventive care refers to lifestyle choices and visits with your health care provider that can promote health and wellness. What does preventive care include? A yearly physical exam. This is also called an annual well check. Dental exams once or twice a year. Routine eye exams. Ask your health care provider how often you should have your eyes checked. Personal lifestyle choices, including: Daily care of your teeth and gums. Regular physical activity. Eating a healthy diet. Avoiding tobacco and drug use. Limiting alcohol use. Practicing safe sex. Taking low-dose aspirin every day. Taking vitamin and mineral supplements as recommended by your health care provider. What happens during an annual well check? The services  and screenings done by your health care provider during your annual well check will depend on your age, overall health, lifestyle risk factors, and family history of disease. Counseling  Your health care provider may ask you questions about your: Alcohol use. Tobacco use. Drug use. Emotional well-being. Home and relationship well-being. Sexual activity. Eating habits. History of falls. Memory and ability to understand (cognition). Work and work Statistician. Reproductive health. Screening  You may have the following tests or measurements: Height, weight, and BMI. Blood pressure. Lipid and cholesterol levels. These may be checked every 5 years, or more frequently if you are over 53 years old. Skin check. Lung cancer screening. You may have this screening every year starting at age 53 if you have a 30-pack-year history of smoking and currently smoke or have quit within the past 15 years. Fecal occult blood test (FOBT) of the stool. You may have this test every year starting at age 22. Flexible sigmoidoscopy or colonoscopy. You may have a sigmoidoscopy every 5 years or a colonoscopy every 10 years starting at age 46. Hepatitis C blood test. Hepatitis B blood test. Sexually transmitted disease (STD) testing. Diabetes screening. This is done by checking your blood sugar (glucose) after you have not eaten for a while (fasting). You may have this done every 1-3 years. Bone density scan. This is done to screen for osteoporosis. You may have this done starting at age 84. Mammogram. This may be done every 1-2  years. Talk to your health care provider about how often you should have regular mammograms. Talk with your health care provider about your test results, treatment options, and if necessary, the need for more tests. Vaccines  Your health care provider may recommend certain vaccines, such as: Influenza vaccine. This is recommended every year. Tetanus, diphtheria, and acellular pertussis  (Tdap, Td) vaccine. You may need a Td booster every 10 years. Zoster vaccine. You may need this after age 64. Pneumococcal 13-valent conjugate (PCV13) vaccine. One dose is recommended after age 51. Pneumococcal polysaccharide (PPSV23) vaccine. One dose is recommended after age 17. Talk to your health care provider about which screenings and vaccines you need and how often you need them. This information is not intended to replace advice given to you by your health care provider. Make sure you discuss any questions you have with your health care provider. Document Released: 12/06/2015 Document Revised: 07/29/2016 Document Reviewed: 09/10/2015 Elsevier Interactive Patient Education  2017 [place] Prevention in the Home Falls can cause injuries. They can happen to people of all ages. There are many things you can do to make your home safe and to help prevent falls. What can I do on the outside of my home? Regularly fix the edges of walkways and driveways and fix any cracks. Remove anything that might make you trip as you walk through a door, such as a raised step or threshold. Trim any bushes or trees on the path to your home. Use bright outdoor lighting. Clear any walking paths of anything that might make someone trip, such as rocks or tools. Regularly check to see if handrails are loose or broken. Make sure that both sides of any steps have handrails. Any raised decks and porches should have guardrails on the edges. Have any leaves, snow, or ice cleared regularly. Use sand or salt on walking paths during winter. Clean up any spills in your garage right away. This includes oil or grease spills. What can I do in the bathroom? Use night lights. Install grab bars by the toilet and in the tub and shower. Do not use towel bars as grab bars. Use non-skid mats or decals in the tub or shower. If you need to sit down in the shower, use a plastic, non-slip stool. Keep the floor dry. Clean  up any water that spills on the floor as soon as it happens. Remove soap buildup in the tub or shower regularly. Attach bath mats securely with double-sided non-slip rug tape. Do not have throw rugs and other things on the floor that can make you trip. What can I do in the bedroom? Use night lights. Make sure that you have a light by your bed that is easy to reach. Do not use any sheets or blankets that are too big for your bed. They should not hang down onto the floor. Have a firm chair that has side arms. You can use this for support while you get dressed. Do not have throw rugs and other things on the floor that can make you trip. What can I do in the kitchen? Clean up any spills right away. Avoid walking on wet floors. Keep items that you use a lot in easy-to-reach places. If you need to reach something above you, use a strong step stool that has a grab bar. Keep electrical cords out of the way. Do not use floor polish or wax that makes floors slippery. If you must use wax, use non-skid floor  wax. Do not have throw rugs and other things on the floor that can make you trip. What can I do with my stairs? Do not leave any items on the stairs. Make sure that there are handrails on both sides of the stairs and use them. Fix handrails that are broken or loose. Make sure that handrails are as long as the stairways. Check any carpeting to make sure that it is firmly attached to the stairs. Fix any carpet that is loose or worn. Avoid having throw rugs at the top or bottom of the stairs. If you do have throw rugs, attach them to the floor with carpet tape. Make sure that you have a light switch at the top of the stairs and the bottom of the stairs. If you do not have them, ask someone to add them for you. What else can I do to help prevent falls? Wear shoes that: Do not have high heels. Have rubber bottoms. Are comfortable and fit you well. Are closed at the toe. Do not wear sandals. If you  use a stepladder: Make sure that it is fully opened. Do not climb a closed stepladder. Make sure that both sides of the stepladder are locked into place. Ask someone to hold it for you, if possible. Clearly mark and make sure that you can see: Any grab bars or handrails. First and last steps. Where the edge of each step is. Use tools that help you move around (mobility aids) if they are needed. These include: Canes. Walkers. Scooters. Crutches. Turn on the lights when you go into a dark area. Replace any light bulbs as soon as they burn out. Set up your furniture so you have a clear path. Avoid moving your furniture around. If any of your floors are uneven, fix them. If there are any pets around you, be aware of where they are. Review your medicines with your doctor. Some medicines can make you feel dizzy. This can increase your chance of falling. Ask your doctor what other things that you can do to help prevent falls. This information is not intended to replace advice given to you by your health care provider. Make sure you discuss any questions you have with your health care provider. Document Released: 09/05/2009 Document Revised: 04/16/2016 Document Reviewed: 12/14/2014 Elsevier Interactive Patient Education  2017 [employer].

## 2023-01-22 ENCOUNTER — Other Ambulatory Visit: Payer: Self-pay | Admitting: Nurse Practitioner

## 2023-01-22 ENCOUNTER — Encounter: Payer: Self-pay | Admitting: Nurse Practitioner

## 2023-01-22 ENCOUNTER — Ambulatory Visit (INDEPENDENT_AMBULATORY_CARE_PROVIDER_SITE_OTHER): Payer: Medicare Other | Admitting: Nurse Practitioner

## 2023-01-22 VITALS — BP 120/78 | HR 62 | Temp 97.9°F | Resp 16 | Ht 65.0 in | Wt 166.6 lb

## 2023-01-22 DIAGNOSIS — I700 Atherosclerosis of aorta: Secondary | ICD-10-CM | POA: Diagnosis not present

## 2023-01-22 DIAGNOSIS — Z0001 Encounter for general adult medical examination with abnormal findings: Secondary | ICD-10-CM

## 2023-01-22 DIAGNOSIS — E782 Mixed hyperlipidemia: Secondary | ICD-10-CM

## 2023-01-22 DIAGNOSIS — I10 Essential (primary) hypertension: Secondary | ICD-10-CM

## 2023-01-22 DIAGNOSIS — G250 Essential tremor: Secondary | ICD-10-CM

## 2023-01-22 DIAGNOSIS — E785 Hyperlipidemia, unspecified: Secondary | ICD-10-CM

## 2023-01-22 LAB — COMPREHENSIVE METABOLIC PANEL
ALT: 12 U/L (ref 0–35)
AST: 18 U/L (ref 0–37)
Albumin: 4 g/dL (ref 3.5–5.2)
Alkaline Phosphatase: 53 U/L (ref 39–117)
BUN: 18 mg/dL (ref 6–23)
CO2: 31 mEq/L (ref 19–32)
Calcium: 9.7 mg/dL (ref 8.4–10.5)
Chloride: 106 mEq/L (ref 96–112)
Creatinine, Ser: 0.81 mg/dL (ref 0.40–1.20)
GFR: 74.06 mL/min (ref >60.00)
Glucose, Bld: 75 mg/dL (ref 70–99)
Potassium: 4.9 mEq/L (ref 3.5–5.1)
Sodium: 143 mEq/L (ref 135–145)
Total Bilirubin: 1 mg/dL (ref 0.2–1.2)
Total Protein: 6.4 g/dL (ref 6.0–8.3)

## 2023-01-22 LAB — LIPID PANEL
Cholesterol: 140 mg/dL (ref 0–200)
HDL: 68.5 mg/dL (ref >39.00)
LDL Cholesterol: 61 mg/dL (ref 0–99)
NonHDL: 71.68
Total CHOL/HDL Ratio: 2
Triglycerides: 53 mg/dL (ref 0.0–149.0)
VLDL: 10.6 mg/dL (ref 0.0–40.0)

## 2023-01-22 NOTE — Assessment & Plan Note (Addendum)
Repeat lipid panel Maintain crestor dose

## 2023-01-22 NOTE — Assessment & Plan Note (Signed)
BP at goal LDL at goal with crestor No DM Followed by cardiology

## 2023-01-22 NOTE — Assessment & Plan Note (Signed)
BP at goal with propanolol BP Readings from Last 3 Encounters:  01/22/23 120/78  09/22/22 126/80  09/14/22 126/80

## 2023-01-22 NOTE — Progress Notes (Signed)
Complete physical exam  Patient: Gwendolyn Sampson   DOB: 02-10-1951   72 y.o. Female  MRN: [id number] Visit Date: 01/22/2023  Subjective:    Chief Complaint  Patient presents with   Annual Exam    Fasting    Gwendolyn Sampson is a 72 y.o. female who presents today for a complete physical exam. She reports consuming a general diet.  Riding stationery bike  She generally feels well. She reports sleeping well. She does not have additional problems to discuss today.  Vision:Yes Dental:Yes  BP Readings from Last 3 Encounters:  01/22/23 120/78  09/22/22 126/80  09/14/22 126/80   Wt Readings from Last 3 Encounters:  01/22/23 166 lb 9.6 oz (75.6 kg)  11/10/22 163 lb (73.9 kg)  09/22/22 162 lb 9.6 oz (73.8 kg)   Most recent fall risk assessment:    01/22/2023    8:23 AM  Fall Risk   Falls in the past year? 1  Number falls in past yr: 0  Injury with Fall? 1  Risk for fall due to : Other (Comment)  Follow up Falls evaluation completed   Depression screen:Yes - No Depression  Most recent depression screenings:    11/10/2022    9:33 AM 11/05/2021    9:13 AM  PHQ 2/9 Scores  PHQ - 2 Score 0 0   HPI  Essential tremor Improving but has intermittent dizziness since head concussion, worse if bends forward or with head below heart, describes as mild No change in tremor  No change in voice, no dysphagia, no change in gait had eval by neurology.  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 No typical groundglass type peripheral infiltrates to suggest viral pneumonia 2. Peripheral opacities in all lobes of both lungs with an upper lobe predominance, presumed fibrosis given the history of interstitial lung disease. - Covid IgG   03/31/2019  Positive   - 04/19/2019   Walked RA  2 laps @  approx 248f each @ very fast pace  stopped due to   MRSA (methicillin resistant Staphylococcus aureus) 2010   wrist, waist, buttock   [place]romuscular [place]    Past Surgical History:  Procedure Laterality Date   EYE SURGERY     cataract   LIPOMA EXCISION     on bacl   STRABISMUS SURGERY Bilateral 10/22/2017   Procedure: REPAIR STRABISMUS BILATERAL;  Surgeon: YEveritt Amber MD;  [place] [facility]  Service: Ophthalmology;  Laterality: Bilateral;   TONSILLECTOMY     Social History   Socioeconomic History   Marital status: Divorced    Spouse name: Not on file   Number of children: Not on file   Years of education: Not on file  Highest education level: Not on file  Occupational History   Not on file  Tobacco Use   Smoking status: Never   Smokeless tobacco: Never  Vaping Use   Vaping Use: Never used  Substance and Sexual Activity   Alcohol use: Yes    Comment: 1-3 yearly   Drug use: Never   Sexual activity: Not Currently    Birth control/protection: Post-menopausal, None    Comment: Previously Depo-Provera injections  Other Topics Concern   Not on file  Social History Narrative   Not on file   Social Determinants of Health   Financial Resource Strain: Low Risk  (11/10/2022)   Overall Financial Resource Strain (CARDIA)    Difficulty of Paying Living Expenses: Not hard at all  Food Insecurity: No Food Insecurity (11/10/2022)   Hunger Vital Sign    Worried About Running  Out of Food in the Last Year: Never true    Ran Out of Food in the Last Year: Never true  Transportation Needs: No Transportation Needs (11/10/2022)   PRAPARE - Hydrologist (Medical): No    Lack of Transportation (Non-Medical): No  Physical Activity: Insufficiently Active (11/10/2022)   Exercise Vital Sign    Days of Exercise per Week: 7 days    Minutes of Exercise per Session: 20 min  Stress: No Stress Concern Present (11/10/2022)   [place]    Feeling of Stress : Not at all  Social Connections: Socially Isolated (11/05/2021)   Social Connection and Isolation Panel [NHANES]    Frequency of Communication with Friends and Family: Twice a week    Frequency of Social Gatherings with Friends and Family: Twice a week    Attends Religious Services: Never    Marine scientist or Organizations: No    Attends Archivist Meetings: Never    Marital Status: Divorced  Human resources officer Violence: Not At Risk (11/05/2021)   Humiliation, Afraid, Rape, and Kick questionnaire    Fear of Current or Ex-Partner: No    Emotionally Abused: No    Physically Abused: No    Sexually Abused: No   Family Status  Relation Name Status   Mother  Deceased   Father  Deceased   Field seismologist  (Not Specified)   PGM  (Not Specified)   Neg Hx  (Not Specified)   Family History  Problem Relation Age of Onset   Other Mother        essesntial tremors   Diabetes Mother    Dementia Mother    Pancreatic cancer Father    Heart disease Father    Heart disease Paternal Aunt    Heart disease Paternal Grandmother    Esophageal cancer Neg Hx    Colon cancer Neg Hx    Rectal cancer Neg Hx    Stomach cancer Neg Hx    Allergies  Allergen Reactions   Other Itching    Tree and shrubs.    Patient Care Team: Bessie Livingood, Charlene Brooke, NP as PCP - General (Internal Medicine) Berniece Salines, DO as PCP - Cardiology  (Cardiology) Ob/Gyn, [facility]   Medications: Outpatient Medications Prior to Visit  Medication Sig   aspirin EC 81 MG tablet Take 81 mg by mouth daily.   Biotin 1000 MCG CHEW Chew 1,000 mcg by mouth daily.   Cholecalciferol (VITAMIN D-3) 25 MCG (1000 UT) CAPS Take by mouth.   diclofenac Sodium (VOLTAREN) 1 % GEL Apply  2 g topically 4 (four) times daily.   Docosahexaenoic Acid (DHA NATURAL OMEGA-3 PO) Take by mouth.   ibuprofen (ADVIL) 600 MG tablet Take 600 mg by mouth every 6 (six) hours as needed.   Krill Oil (OMEGA-3) 500 MG CAPS Take 500 mg by mouth daily.   levothyroxine (SYNTHROID) 75 MCG tablet Take 1 tablet by mouth daily.   multivitamin-lutein (OCUVITE-LUTEIN) CAPS capsule Take 1 capsule by mouth daily.   Nutritional Supplements (JUICE PLUS FIBRE PO) Take by mouth.   propranolol (INDERAL) 20 MG tablet Take 20 mg by mouth daily.   Vitamin E 180 MG CAPS Take 180 mg by mouth daily.   [DISCONTINUED] pyridoxine (B-6) 100 MG tablet Take 100 mg by mouth daily. (Patient not taking: Reported on 11/10/2022)   [DISCONTINUED] rosuvastatin (CRESTOR) 10 MG tablet TAKE ONE TABLET BY MOUTH ONE TIME DAILY   No facility-administered medications prior to visit.    Review of Systems  Constitutional:  Negative for activity change, appetite change and unexpected weight change.  Respiratory: Negative.    Cardiovascular: Negative.   Gastrointestinal: Negative.   Endocrine: Negative for cold intolerance and heat intolerance.  Genitourinary: Negative.   Musculoskeletal: Negative.   Skin: Negative.   Neurological: Negative.   Hematological: Negative.   Psychiatric/Behavioral:  Negative for behavioral problems, decreased concentration, dysphoric mood, hallucinations, self-injury, sleep disturbance and suicidal ideas. The patient is not nervous/anxious.         Objective:  BP 120/78   Pulse 62   Temp 97.9 F (36.6 C) (Temporal)   Resp 16   Ht '5\' 5"'$  (1.651 m)   Wt 166 lb 9.6 oz (75.6  kg)   SpO2 98%   BMI 27.72 kg/m     Physical Exam Vitals and nursing note reviewed.  Constitutional:      General: She is not in acute distress. HENT:     Right Ear: Tympanic membrane, ear canal and external ear normal.     Left Ear: Tympanic membrane, ear canal and external ear normal.     Nose: Nose normal.  Eyes:     Extraocular Movements: Extraocular movements intact.     Conjunctiva/sclera: Conjunctivae normal.     Pupils: Pupils are equal, round, and reactive to light.  Neck:     Thyroid: No thyroid mass, thyromegaly or thyroid tenderness.  Cardiovascular:     Rate and Rhythm: Normal rate and regular rhythm.     Pulses: Normal pulses.     Heart sounds: Normal heart sounds.  Pulmonary:     Effort: Pulmonary effort is normal.     Breath sounds: Normal breath sounds.  Abdominal:     General: Bowel sounds are normal.     Palpations: Abdomen is soft.  Musculoskeletal:        General: Normal range of motion.     Cervical back: Normal range of motion and neck supple.     Right lower leg: No edema.     Left lower leg: No edema.  Lymphadenopathy:     Cervical: No cervical adenopathy.  Skin:    General: Skin is warm and dry.  Neurological:     Mental Status: She is alert and oriented to person, place, and time.     Cranial Nerves: No cranial nerve deficit.     Motor: Tremor present. No weakness, atrophy, seizure activity or pronator drift.     Coordination: Coordination is intact.     Gait: Gait is intact.     Comments: Only  Head tremor noted  Psychiatric:        Mood and Affect: Mood normal.        Behavior: Behavior normal.        Thought Content: Thought content normal.     No results found for any visits on 01/22/23.    Assessment & Plan:    Routine Health Maintenance and Physical Exam  Immunization History  Administered Date(s) Administered   Fluad Quad(high Dose 65+) 08/15/2019, 08/09/2020, 08/06/2022   Influenza,inj,Quad PF,6+ Mos 08/23/2017    Influenza-Unspecified 09/22/2014, 08/23/2016, 08/23/2017, 08/23/2018, 08/24/2019, 08/13/2020, 08/05/2021   PFIZER(Purple Top)SARS-COV-2 Vaccination 01/21/2020, 02/05/2020, 09/23/2020, 08/05/2021   Pneumococcal Conjugate-13 03/24/2019   Pneumococcal Polysaccharide-23 02/05/2021   Tdap 03/21/2021   Unspecified SARS-COV-2 Vaccination 08/10/2022   Zoster Recombinat (Shingrix) 03/24/2019, 08/17/2019    Health Maintenance  Topic Date Due   COVID-19 Vaccine (6 - 2023-24 season) 02/07/2023 (Originally 10/05/2022)   Hepatitis C Screening  01/22/2024 (Originally 08/20/1969)   MAMMOGRAM  09/12/2023   COLONOSCOPY (Pts 45-13yr Insurance coverage will need to be confirmed)  09/20/2023   Medicare Annual Wellness (AWV)  11/11/2023   DTaP/Tdap/Td (2 - Td or Tdap) 03/22/2031   Pneumonia Vaccine 70 Years old  Completed   INFLUENZA VACCINE  Completed   DEXA SCAN  Completed   Zoster Vaccines- Shingrix  Completed   HPV VACCINES  Aged Out    Discussed health benefits of physical activity, and encouraged her to engage in regular exercise appropriate for her age and condition.  Problem List Items Addressed This Visit       Cardiovascular and Mediastinum   [place]    BP at goal LDL at goal with crestor No DM Followed by cardiology      Relevant Orders   Lipid panel   Primary hypertension    BP at goal with propanolol BP Readings from Last 3 Encounters:  01/22/23 120/78  09/22/22 126/80  09/14/22 126/80           Nervous and Auditory   Essential tremor    Improving but has intermittent dizziness since head concussion, worse if bends forward or with head below heart, describes as mild No change in tremor  No change in voice, no dysphagia, no change in gait had eval by neurology.  Current use of propanolol        Other   Hyperlipidemia    Repeat lipid panel Maintain crestor dose      Relevant Orders   Lipid panel   Other Visit Diagnoses     Encounter for  preventative adult health care exam with abnormal findings    -  Primary   Relevant Orders   Comprehensive metabolic panel      Return in about 6 months (around 07/25/2023) for HTN, hyperlipidemia (fasting).     CWilfred Lacy NP

## 2023-01-22 NOTE — Patient Instructions (Signed)
Go to lab Maintain current medications.

## 2023-01-22 NOTE — Assessment & Plan Note (Addendum)
Improving but has intermittent dizziness since head concussion, worse if bends forward or with head below heart, describes as mild No change in tremor  No change in voice, no dysphagia, no change in gait had eval by neurology.  Current use of propanolol

## 2023-01-25 NOTE — Progress Notes (Signed)
Stable Follow instructions as discussed during office visit.

## 2023-04-26 ENCOUNTER — Ambulatory Visit (HOSPITAL_BASED_OUTPATIENT_CLINIC_OR_DEPARTMENT_OTHER)
Admission: RE | Admit: 2023-04-26 | Discharge: 2023-04-26 | Disposition: A | Discharge status: Home / Self Care | Payer: Medicare Other | Source: Ambulatory Visit | Attending: Cardiology | Admitting: Cardiology

## 2023-04-26 DIAGNOSIS — I700 Atherosclerosis of aorta: Secondary | ICD-10-CM | POA: Diagnosis present

## 2023-04-26 MED ORDER — IOHEXOL 350 MG/ML SOLN
100.0000 mL | Freq: Once | INTRAVENOUS | Status: AC | PRN
  Administered 2023-04-26: 75 mL via INTRAVENOUS

## 2023-04-29 ENCOUNTER — Encounter: Payer: Self-pay | Admitting: Cardiology

## 2023-10-06 ENCOUNTER — Encounter: Payer: Self-pay | Admitting: Gastroenterology

## 2023-11-15 ENCOUNTER — Ambulatory Visit (INDEPENDENT_AMBULATORY_CARE_PROVIDER_SITE_OTHER): Payer: Medicare Other

## 2023-11-15 DIAGNOSIS — Z0000 Encounter for general adult medical examination without abnormal findings: Secondary | ICD-10-CM

## 2023-11-15 NOTE — Progress Notes (Signed)
Subjective:   Gwendolyn Sampson is a 72 y.o. female who presents for Medicare Annual (Subsequent) preventive examination.  Visit Complete: Virtual I connected with  Gwendolyn Sampson on 11/15/23 by a audio enabled telemedicine application and verified that I am speaking with the correct person using two identifiers.  Patient Location: Home  Provider Location: Office/Clinic  I discussed the limitations of evaluation and management by telemedicine. The patient expressed understanding and agreed to proceed.  Vital Signs: Because this visit was a virtual/telehealth visit, some criteria may be missing or patient reported. Any vitals not documented were not able to be obtained and vitals that have been documented are patient reported.    Cardiac Risk Factors include: advanced age (>32men, >66 women);dyslipidemia;hypertension     Objective:    Today's Vitals   There is no height or weight on file to calculate BMI.     11/15/2023    9:18 AM 11/10/2022    9:30 AM 11/05/2021    9:12 AM 10/22/2017    7:32 AM 10/20/2017   10:12 AM  Advanced Directives  Does Patient Have a Medical Advance Directive? Yes Yes Yes Yes Yes  Type of Estate agent of [place];Living will Healthcare Power of [place];Living will Healthcare Power of [place];Living will Living will Living will  Does patient want to make changes to medical advance directive?    No - Patient declined   Copy of Healthcare Power of Attorney in Chart? Yes - validated most recent copy scanned in chart (See row information) Yes - validated most recent copy scanned in chart (See row information) No - copy requested      Current Medications (verified) Outpatient Encounter Medications as of 11/15/2023  Medication Sig   aspirin EC 81 MG tablet Take 81 mg by mouth daily.   Biotin 1000 MCG CHEW Chew 1,000 mcg by mouth daily.   Cholecalciferol (VITAMIN D-3) 25 MCG (1000 UT) CAPS Take by mouth.   ibuprofen (ADVIL) 600 MG  tablet Take 600 mg by mouth every 6 (six) hours as needed.   Krill Oil (OMEGA-3) 500 MG CAPS Take 500 mg by mouth daily.   levothyroxine (SYNTHROID) 75 MCG tablet Take 1 tablet by mouth daily.   propranolol (INDERAL) 20 MG tablet Take 20 mg by mouth daily.   rosuvastatin (CRESTOR) 10 MG tablet TAKE ONE TABLET BY MOUTH ONE TIME DAILY   Vitamin E 180 MG CAPS Take 180 mg by mouth daily.   diclofenac Sodium (VOLTAREN) 1 % GEL Apply 2 g topically 4 (four) times daily. (Patient not taking: Reported on 11/15/2023)   Docosahexaenoic Acid (DHA NATURAL OMEGA-3 PO) Take by mouth. (Patient not taking: Reported on 11/15/2023)   multivitamin-lutein (OCUVITE-LUTEIN) CAPS capsule Take 1 capsule by mouth daily. (Patient not taking: Reported on 11/15/2023)   Nutritional Supplements (JUICE PLUS FIBRE PO) Take by mouth. (Patient not taking: Reported on 11/15/2023)   No facility-administered encounter medications on file as of 11/15/2023.    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 No typical groundglass  type peripheral infiltrates to suggest viral pneumonia 2. Peripheral opacities in all lobes of both lungs with an upper lobe predominance, presumed fibrosis given the history of interstitial lung disease. - Covid IgG   03/31/2019  Positive   - 04/19/2019   Walked RA  2 laps @  approx 246ft each @ very fast pace  stopped due to   MRSA (methicillin resistant Staphylococcus aureus) 2010   wrist, waist, buttock   Neuromuscular disorder (HCC)    Past Surgical History:  Procedure Laterality Date   EYE SURGERY     cataract    LIPOMA EXCISION     on bacl   STRABISMUS SURGERY Bilateral 10/22/2017   Procedure: REPAIR STRABISMUS BILATERAL;  Surgeon: Verne Carrow, MD;  Location: [facility];  Service: Ophthalmology;  Laterality: Bilateral;   TONSILLECTOMY     Family History  Problem Relation Age of Onset   Other Mother        essesntial tremors   Diabetes Mother    Dementia Mother    Pancreatic cancer Father    Heart disease Father    Heart disease Paternal Aunt    Heart disease Paternal Grandmother    Esophageal cancer Neg Hx    Colon cancer Neg Hx    Rectal cancer Neg Hx    Stomach cancer Neg Hx    Social History   Socioeconomic History   Marital status: Divorced    Spouse name: Not on file   Number of children: Not on file   Years of education: Not on file   Highest education level: Not on file  Occupational History   Not on file  Tobacco Use   Smoking status: Never   Smokeless tobacco: Never  Vaping Use   Vaping status: Never Used  Substance and Sexual Activity   Alcohol use: Yes    Comment: 1-3 yearly   Drug use: Never   Sexual activity: Not Currently    Birth control/protection: Post-menopausal, None    Comment: Previously Depo-Provera injections  Other Topics Concern   Not on file  Social History Narrative   Not on file   Social Drivers of Health   Financial Resource Strain: Low Risk  (11/15/2023)   Overall Financial Resource Strain (CARDIA)    Difficulty of Paying Living Expenses: Not hard at all  Food Insecurity: No Food Insecurity (11/15/2023)   Hunger Vital Sign    Worried About Running Out of Food in the Last Year: Never true    Ran Out of Food in the Last Year: Never true  Transportation Needs: No Transportation Needs (11/15/2023)   PRAPARE - Administrator, Civil Service (Medical): No    Lack of Transportation (Non-Medical): No  Physical Activity: Inactive (11/15/2023)   Exercise Vital Sign    Days of Exercise per Week: 0 days    Minutes  of Exercise per Session: 0 min  Stress: No Stress Concern Present (11/15/2023)   [employer] of Occupational Health - Occupational Stress Questionnaire    Feeling of Stress : Not at all  Social Connections: Socially Isolated (11/15/2023)   Social Connection and Isolation Panel [NHANES]    Frequency of Communication with Friends and Family: Never    Frequency of Social Gatherings with Friends and Family: More than three times a week    Attends Religious Services: Never    Database administrator or Organizations: No    Attends Banker Meetings: Never    Marital Status: Divorced  Tobacco Counseling Counseling given: Not Answered   Clinical Intake:  Pre-visit preparation completed: Yes  Pain : No/denies pain     Nutritional Risks: None Diabetes: No  How often do you need to have someone help you when you read instructions, pamphlets, or other written materials from your doctor or pharmacy?: 1 - Never  Interpreter Needed?: No  Information entered by :: NAllen LPN   Activities of Daily Living    11/15/2023    9:12 AM  In your present state of health, do you have any difficulty performing the following activities:  Hearing? 0  Vision? 0  Difficulty concentrating or making decisions? 0  Walking or climbing stairs? 0  Dressing or bathing? 0  Doing errands, shopping? 0  Preparing Food and eating ? N  Using the Toilet? N  In the past six months, have you accidently leaked urine? N  Do you have problems with loss of bowel control? N  Managing your Medications? N  Managing your Finances? N  Housekeeping or managing your Housekeeping? N    Patient Care Team: Nche, Bonna Gains, NP as PCP - General (Internal Medicine) Thomasene Ripple, DO as PCP - Cardiology (Cardiology) Ob/Gyn, [facility] any recent Medical Services you may have received from other than [facility] providers in the past year (date may be approximate).     Assessment:   This is  a routine wellness examination for Gwendolyn Sampson.  Hearing/Vision screen Hearing Screening - Comments:: Denies hearing issues Vision Screening - Comments:: Regular eye exams,    Goals Addressed             This Visit's Progress    Patient Stated       11/15/2023, work on toning muscles       Depression Screen    11/15/2023    9:20 AM 11/10/2022    9:33 AM 11/05/2021    9:13 AM 11/05/2021    9:10 AM 08/08/2021   10:12 AM  PHQ 2/9 Scores  PHQ - 2 Score 0 0 0 0 0  PHQ- 9 Score 0    2    Fall Risk    11/15/2023    9:19 AM 01/22/2023    8:23 AM 11/10/2022    9:31 AM 09/14/2022   10:40 AM 11/05/2021    9:13 AM  Fall Risk   Falls in the past year? 0 1 1 1  0  Comment   fell down the stairs    Number falls in past yr: 0 0 0 0 0  Injury with Fall? 0 1 1 1  0  Risk for fall due to : Medication side effect;Orthopedic patient Other (Comment) Medication side effect    Follow up Falls prevention discussed;Falls evaluation completed Falls evaluation completed Falls evaluation completed;Education provided;Falls prevention discussed  Falls evaluation completed    MEDICARE RISK AT HOME: Medicare Risk at Home Any stairs in or around the home?: Yes If so, are there any without handrails?: No Home free of loose throw rugs in walkways, pet beds, electrical cords, etc?: Yes Adequate lighting in your home to reduce risk of falls?: Yes Life alert?: No Use of a cane, walker or w/c?: No Grab bars in the bathroom?: Yes Shower chair or bench in shower?: Yes Elevated toilet seat or a handicapped toilet?: No  TIMED UP AND GO:  Was the test performed?  No    Cognitive Function:        11/15/2023    9:21 AM 11/10/2022  9:35 AM  6CIT Screen  What Year? 0 points 0 points  What month? 0 points 0 points  What time? 0 points 0 points  Count back from 20 0 points 0 points  Months in reverse 0 points 4 points  Repeat phrase 2 points 2 points  Total Score 2 points 6 points     Immunizations Immunization History  Administered Date(s) Administered   Fluad Quad(high Dose 65+) 08/15/2019, 08/09/2020, 08/06/2022, 09/28/2023   Influenza,inj,Quad PF,6+ Mos 08/23/2017   Influenza-Unspecified 09/22/2014, 08/23/2016, 08/23/2017, 08/23/2018, 08/24/2019, 08/13/2020, 08/05/2021   PFIZER(Purple Top)SARS-COV-2 Vaccination 01/21/2020, 02/05/2020, 09/23/2020, 08/05/2021   Pneumococcal Conjugate-13 03/24/2019   Pneumococcal Polysaccharide-23 02/05/2021   Tdap 03/21/2021   Unspecified SARS-COV-2 Vaccination 08/10/2022   Zoster Recombinant(Shingrix) 03/24/2019, 08/17/2019    TDAP status: Up to date  Flu Vaccine status: Up to date  Pneumococcal vaccine status: Up to date  Covid-19 vaccine status: Information provided on how to obtain vaccines.   Qualifies for Shingles Vaccine? Yes   Zostavax completed Yes   Shingrix Completed?: Yes  Screening Tests Health Maintenance  Topic Date Due   COVID-19 Vaccine (6 - 2024-25 season) 07/25/2023   MAMMOGRAM  09/12/2023   Colonoscopy  09/20/2023   Hepatitis C Screening  01/22/2024 (Originally 08/20/1969)   Medicare Annual Wellness (AWV)  11/14/2024   DTaP/Tdap/Td (2 - Td or Tdap) 03/22/2031   Pneumonia Vaccine 7+ Years old  Completed   INFLUENZA VACCINE  Completed   DEXA SCAN  Completed   Zoster Vaccines- Shingrix  Completed   HPV VACCINES  Aged Out    Health Maintenance  Health Maintenance Due  Topic Date Due   COVID-19 Vaccine (6 - 2024-25 season) 07/25/2023   MAMMOGRAM  09/12/2023   Colonoscopy  09/20/2023    Colorectal cancer screening: Type of screening: Colonoscopy. Completed 09/19/2018. Repeat every 5 years  Mammogram status: Completed 2024. Repeat every year  Bone Density status: Completed 07/07/2022.   Lung Cancer Screening: (Low Dose CT Chest recommended if Age 61-80 years, 20 pack-year currently smoking OR have quit w/in 15years.) does not qualify.   Lung Cancer Screening Referral: no  Additional  Screening:  Hepatitis C Screening: does qualify; Completed declines  Vision Screening: Recommended annual ophthalmology exams for early detection of glaucoma and other disorders of the eye. Is the patient up to date with their annual eye exam?  Yes  Who is the provider or what is the name of the office in which the patient attends annual eye exams? Can't remember name If pt is not established with a provider, would they like to be referred to a provider to establish care? No .   Dental Screening: Recommended annual dental exams for proper oral hygiene  Diabetic Foot Exam: n/a  Community Resource Referral / Chronic Care Management: CRR required this visit?  No   CCM required this visit?  No     Plan:     I have personally reviewed and noted the following in the patient's chart:   Medical and social history Use of alcohol, tobacco or illicit drugs  Current medications and supplements including opioid prescriptions. Patient is not currently taking opioid prescriptions. Functional ability and status Nutritional status Physical activity Advanced directives List of other physicians Hospitalizations, surgeries, and ER visits in previous 12 months Vitals Screenings to include cognitive, depression, and falls Referrals and appointments  In addition, I have reviewed and discussed with patient certain preventive protocols, quality metrics, and best practice recommendations. A written personalized care plan for preventive  services as well as general preventive health recommendations were provided to patient.     Barb Merino, LPN   11/25/7251   After Visit Summary: (MyChart) Due to this being a telephonic visit, the after visit summary with patients personalized plan was offered to patient via MyChart   Nurse Notes: none

## 2023-11-15 NOTE — Patient Instructions (Addendum)
Ms. Garciaramirez , Thank you for taking time to come for your Medicare Wellness Visit. I appreciate your ongoing commitment to your health goals. Please review the following plan we discussed and let me know if I can assist you in the future.   Referrals/Orders/Follow-Ups/Clinician Recommendations: none  This is a list of the screening recommended for you and due dates:  Health Maintenance  Topic Date Due   COVID-19 Vaccine (6 - 2024-25 season) 07/25/2023   Mammogram  09/12/2023   Colon Cancer Screening  09/20/2023   Hepatitis C Screening  01/22/2024*   Medicare Annual Wellness Visit  11/14/2024   DTaP/Tdap/Td vaccine (2 - Td or Tdap) 03/22/2031   Pneumonia Vaccine  Completed   Flu Shot  Completed   DEXA scan (bone density measurement)  Completed   Zoster (Shingles) Vaccine  Completed   HPV Vaccine  Aged Out  *Topic was postponed. The date shown is not the original due date.    Advanced directives: (In Chart) A copy of your advanced directives are scanned into your chart should your provider ever need it.  Next Medicare Annual Wellness Visit scheduled for next year: Yes  Insert Preventive Care attachment Insert FALL PREVENTION attachment if needed

## 2024-01-28 ENCOUNTER — Emergency Department (HOSPITAL_COMMUNITY)

## 2024-01-28 ENCOUNTER — Encounter (HOSPITAL_COMMUNITY): Payer: Self-pay | Admitting: Emergency Medicine

## 2024-01-28 ENCOUNTER — Other Ambulatory Visit: Payer: Self-pay

## 2024-01-28 ENCOUNTER — Emergency Department (HOSPITAL_COMMUNITY)
Admission: EM | Admit: 2024-01-28 | Discharge: 2024-01-28 | Discharge status: Against Medical Advice | Attending: Emergency Medicine | Admitting: Emergency Medicine

## 2024-01-28 DIAGNOSIS — Y92410 Unspecified street and highway as the place of occurrence of the external cause: Secondary | ICD-10-CM | POA: Insufficient documentation

## 2024-01-28 DIAGNOSIS — Z5321 Procedure and treatment not carried out due to patient leaving prior to being seen by health care provider: Secondary | ICD-10-CM | POA: Diagnosis not present

## 2024-01-28 DIAGNOSIS — M545 Low back pain, unspecified: Secondary | ICD-10-CM | POA: Insufficient documentation

## 2024-01-28 MED ORDER — OXYCODONE-ACETAMINOPHEN 5-325 MG PO TABS: 1.0000 | ORAL_TABLET | Freq: Once | ORAL | Status: DC

## 2024-01-28 NOTE — ED Provider Triage Note (Signed)
 Emergency Medicine Provider Triage Evaluation Note  Gwendolyn Sampson , a 73 y.o. female  was evaluated in triage.  Pt complains of back pain.  Patient was seen in the urgent care prior to arrival and had an x-ray of her lumbar back which showed some abnormalities.  She has been to come here for CT of her back.  No numbness, tingling, or weakness of the lower extremities.  No saddle anesthesia.  Review of Systems  Positive: Pain across the low back Negative: Weakness, loss sensation, inability to ambulate independently  Physical Exam  BP (!) 149/99 (BP Location: Right Arm)   Pulse 74   Temp 97.9 F (36.6 C)   Resp 18   Wt 75.6 kg   SpO2 100%   BMI 27.73 kg/m  Gen:   Awake, no distress   Resp:  Normal effort  MSK:   Moves extremities without difficulty  Other:  Low back tenderness  Medical Decision Making  Medically screening exam initiated at 7:46 PM.  Appropriate orders placed.  Gwendolyn Sampson was informed that the remainder of the evaluation will be completed by another provider, this initial triage assessment does not replace that evaluation, and the importance of remaining in the ED until their evaluation is complete.   Gwendolyn Marion, PA-C 01/28/24 1948
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
## 2024-01-28 NOTE — ED Notes (Signed)
 LWBS, pt stated that she needs to let her ride go home due to family priorities.

## 2024-02-07 ENCOUNTER — Ambulatory Visit (INDEPENDENT_AMBULATORY_CARE_PROVIDER_SITE_OTHER): Admitting: Family Medicine

## 2024-02-07 ENCOUNTER — Encounter: Payer: Self-pay | Admitting: Family Medicine

## 2024-02-07 VITALS — BP 106/74 | HR 73 | Temp 97.5°F | Wt 126.8 lb

## 2024-02-07 DIAGNOSIS — M519 Unspecified thoracic, thoracolumbar and lumbosacral intervertebral disc disorder: Secondary | ICD-10-CM | POA: Insufficient documentation

## 2024-02-07 DIAGNOSIS — M4722 Other spondylosis with radiculopathy, cervical region: Secondary | ICD-10-CM | POA: Diagnosis not present

## 2024-02-07 DIAGNOSIS — E785 Hyperlipidemia, unspecified: Secondary | ICD-10-CM | POA: Diagnosis not present

## 2024-02-07 MED ORDER — ROSUVASTATIN CALCIUM 10 MG PO TABS: 10.0000 mg | ORAL_TABLET | Freq: Every day | ORAL | 0 refills | Status: DC

## 2024-02-07 MED ORDER — METHOCARBAMOL 500 MG PO TABS
500.0000 mg | ORAL_TABLET | Freq: Four times a day (QID) | ORAL | 0 refills | Status: DC | PRN
Start: 2024-02-07 — End: 2024-06-16

## 2024-02-07 MED ORDER — IBUPROFEN 600 MG PO TABS
600.0000 mg | ORAL_TABLET | Freq: Three times a day (TID) | ORAL | 0 refills | Status: AC | PRN
Start: 2024-02-07 — End: ?
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
## 2024-03-15 ENCOUNTER — Other Ambulatory Visit: Payer: Self-pay | Admitting: Endocrinology

## 2024-03-15 DIAGNOSIS — E049 Nontoxic goiter, unspecified: Secondary | ICD-10-CM

## 2024-03-24 ENCOUNTER — Ambulatory Visit
Admission: RE | Admit: 2024-03-24 | Discharge: 2024-03-24 | Disposition: A | Discharge status: Home / Self Care | Source: Ambulatory Visit | Attending: Endocrinology | Admitting: Endocrinology

## 2024-03-24 DIAGNOSIS — E049 Nontoxic goiter, unspecified: Secondary | ICD-10-CM

## 2024-05-04 IMAGING — US US THYROID
1 series · 14 of 25 positions shown · non-contrast
Comparison: 05/02/2020

CLINICAL DATA: Nontoxic goiter, unspecified

EXAM:
THYROID ULTRASOUND
TECHNIQUE: Ultrasound examination of the thyroid gland and adjacent soft
tissues was performed.

[Series 1: us thyroid · 0.06mm/px · 49 acquisitions, 14 frames shown]
[im 1/49]
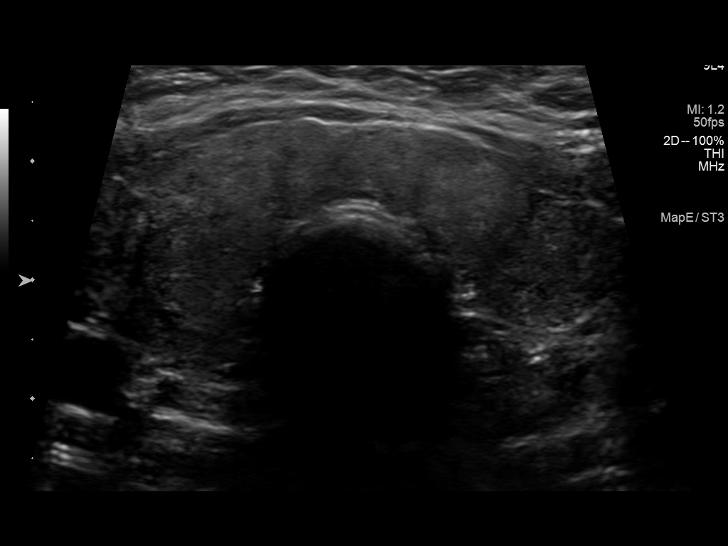
[im 5/49]
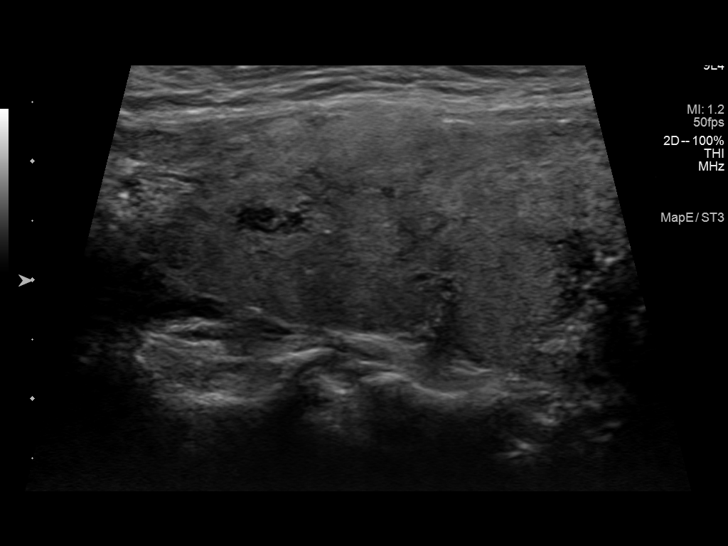
[im 9/49]
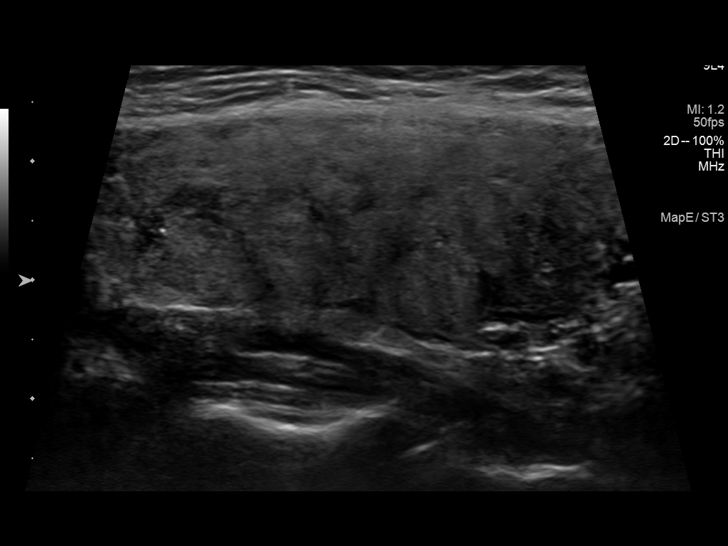
[im 13/49]
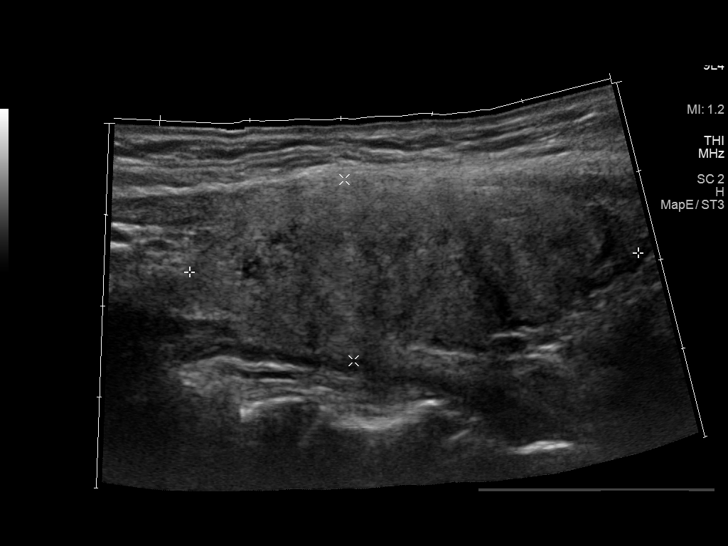
[im 17/49]
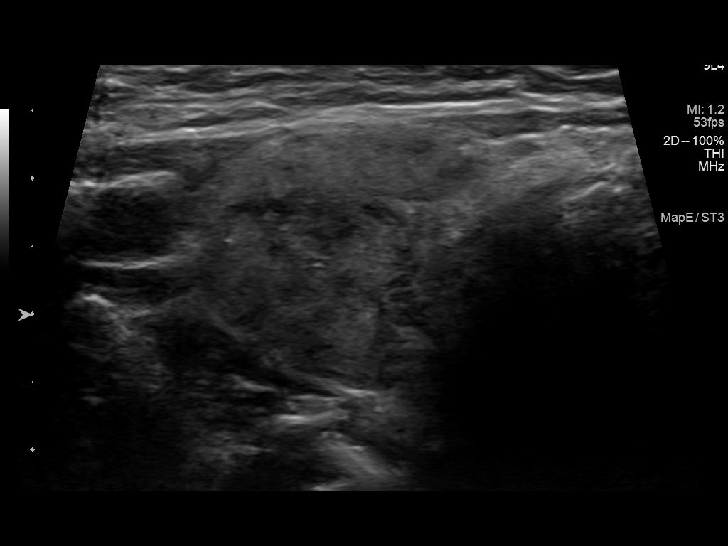
[im 19/49]
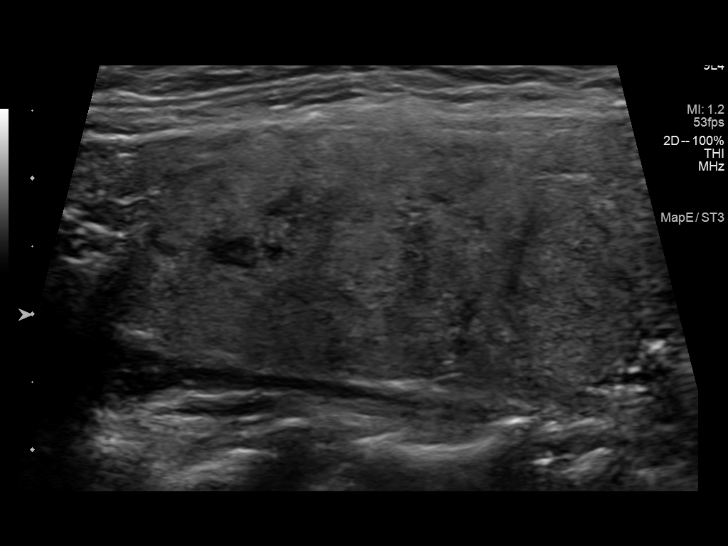
[im 23/49]
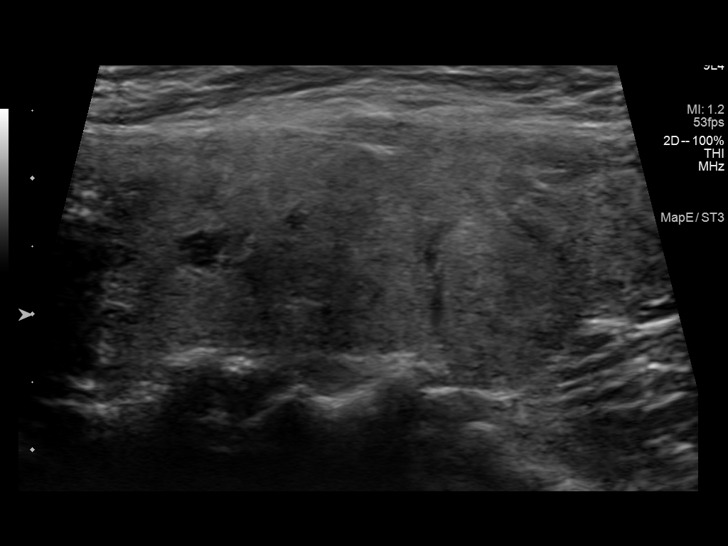
[im 27/49]
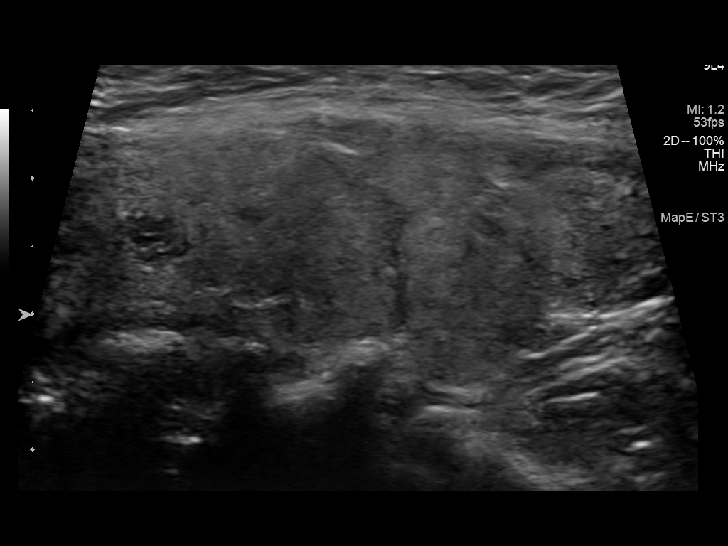
[im 31/49]
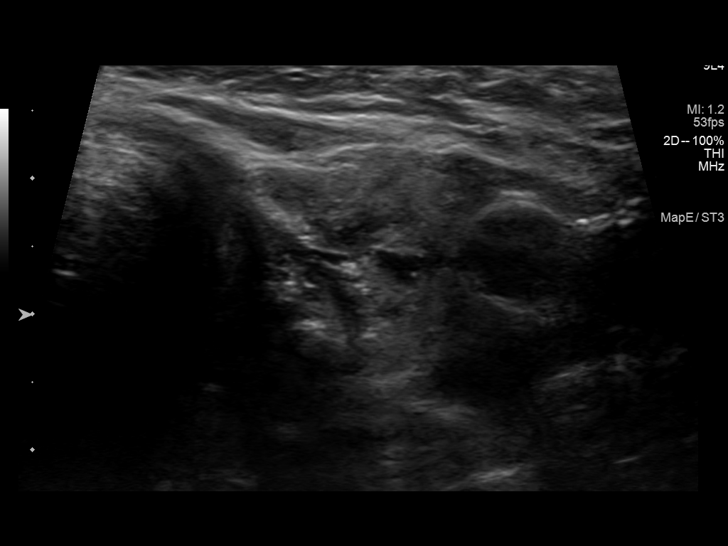
[im 33/49]
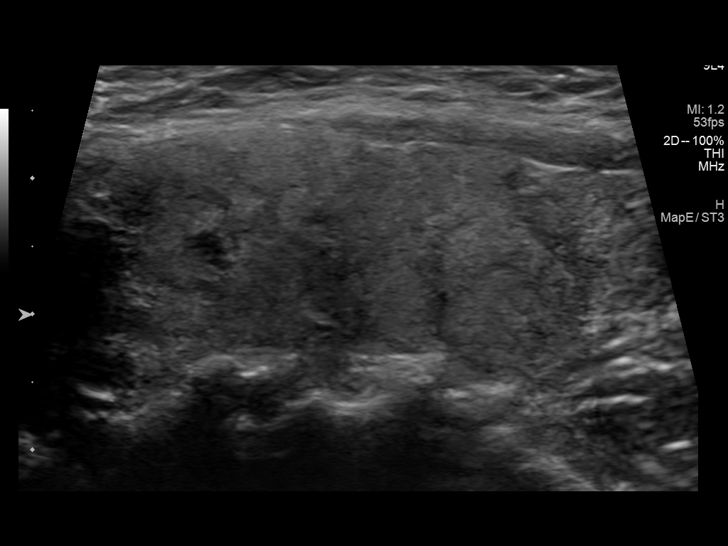
[im 37/49]
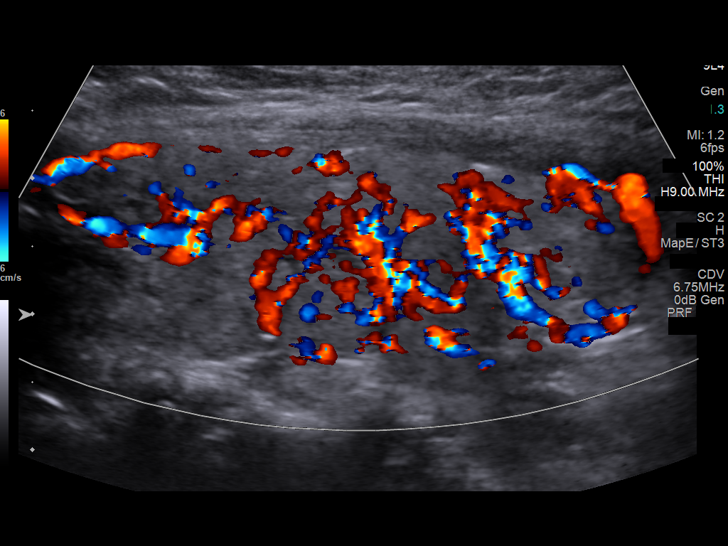
[im 41/49]
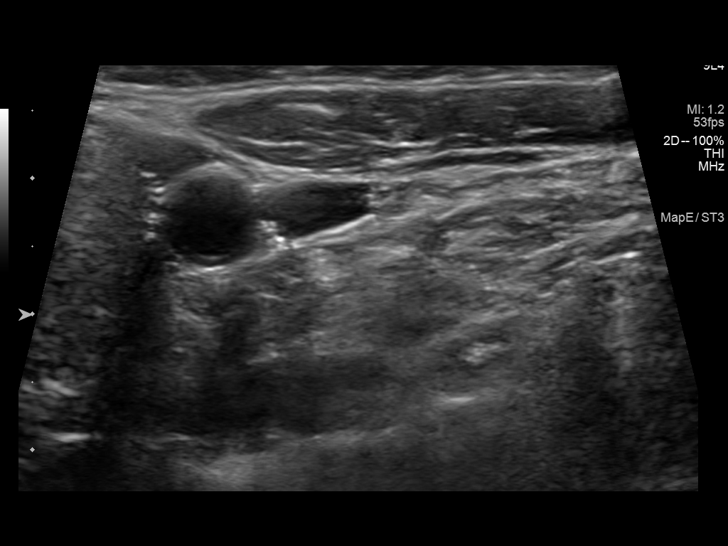
[im 45/49]
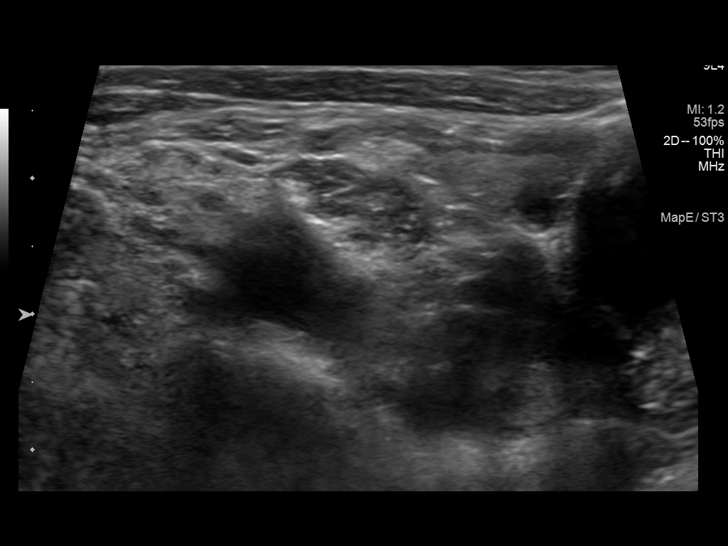
[im 49/49]
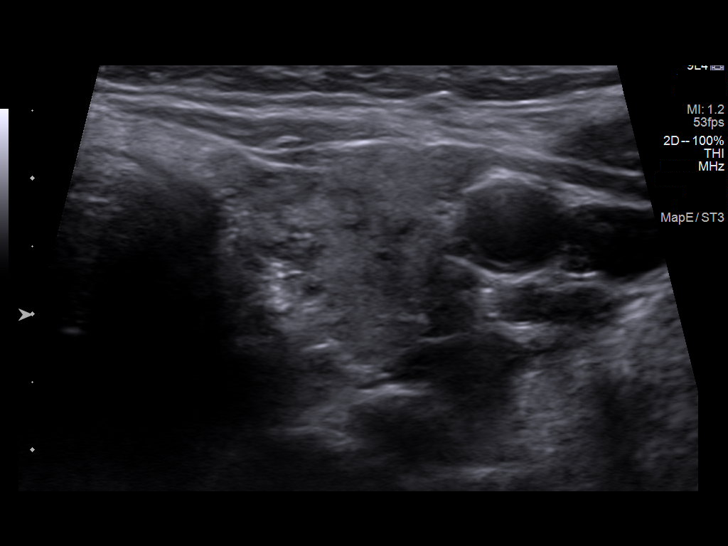

[14 of 25 positions shown; findings below may reference images not displayed]

FINDINGS: Parenchymal Echotexture: Moderately heterogenous

Isthmus: 0.7 cm

Right lobe: 4.9 x 2.0 x 2.1 cm

Left lobe: 4.3 x 1.5 x 1.7 cm

_________________________________________________________

Estimated total number of nodules >/= 1 cm: 0

Number of spongiform nodules >/=  2 cm not described below (TR1): 0

Number of mixed cystic and solid nodules >/= 1.5 cm not described
below (TR2): 0

_________________________________________________________

No discrete nodules are seen within the thyroid gland.
IMPRESSION: Moderate diffuse heterogeneity of the thyroid without discrete
nodule.

The above is in keeping with the ACR TI-RADS recommendations - [HOSPITAL] 6174;[DATE].

## 2024-05-05 IMAGING — CT CTA CHEST W/ AND/OR W/O CM W/ OR W/O DISSECTION AND GATING
1 of 9 series · 3 of 16 positions shown, 4 images · IV contrast (APPLIED)
Comparison: Coronary cardiac CT angiography 09/29/2021

CLINICAL DATA: Chest pain

EXAM:
CT ANGIOGRAPHY CHEST WITH CONTRAST
TECHNIQUE: Multidetector CT imaging of the chest was performed using the
standard protocol during bolus administration of intravenous
contrast. Multiplanar CT image reconstructions and MIPs were
obtained to evaluate the vascular anatomy.

[Series 9: arterial thins · axial · arterial · 0.66mm/px · z∈[+1166,+1309]mm · 3 of 477 slices shown, 4 images]
[im 120/477  soft-tissue]
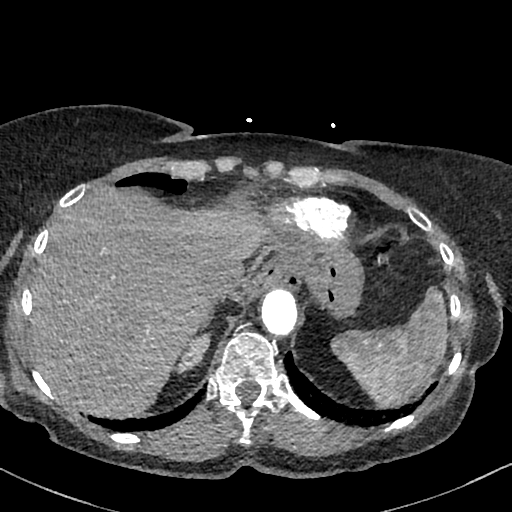
[im 120/477  bone]
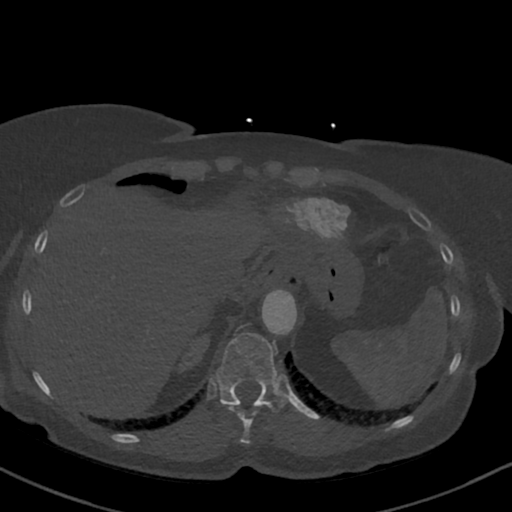
[im 239/477  soft-tissue]
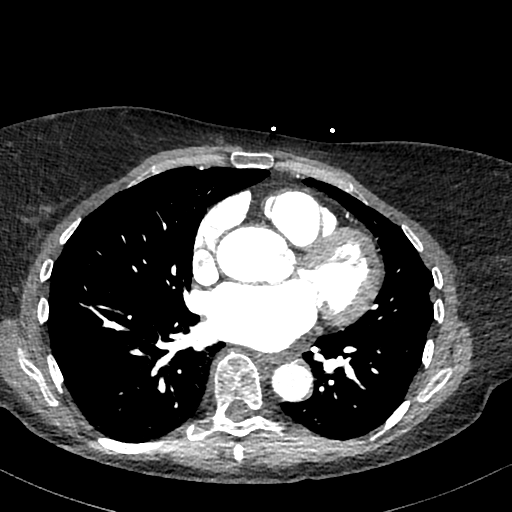
[im 358/477  soft-tissue]
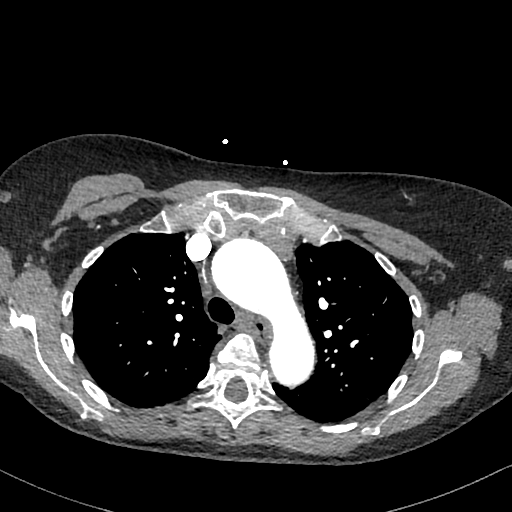

[3 of 16 positions shown; findings below may reference images not displayed]

RADIATION DOSE REDUCTION: This exam was performed according to the
departmental dose-optimization program which includes automated
exposure control, adjustment of the mA and/or kV according to
patient size and/or use of iterative reconstruction technique.

CONTRAST:  100mL OMNIPAQUE IOHEXOL 350 MG/ML SOLN
FINDINGS: Cardiovascular: No aortic intramural hematoma. Scattered calcified
atheromatous plaque of the descending thoracic aorta. Heart size
within normal limits. Left anterior descending coronary artery
calcifications are seen.

Ascending thoracic aortic aneurysm measures maximum of 4.2 cm.

Low-density seen in the region of the right lower lobe pulmonary
artery (image 64, series 8 and image 69 of series 12) is likely
motion artifact.

Mediastinum/Nodes: No enlarged mediastinal, hilar, or axillary lymph
nodes. Thyroid gland, trachea, and esophagus demonstrate no
significant findings.

Lungs/Pleura: Lungs are clear. No pleural effusion or pneumothorax.

Upper Abdomen: Small hiatal hernia.  No acute abnormality.

Musculoskeletal: No chest wall abnormality. No acute or significant
osseous findings.

Review of the MIP images confirms the above findings.
IMPRESSION: 1. Ascending thoracic aortic aneurysm measuring up to 4.2 cm.
2. Low-density seen in the region of the right lower lobe pulmonary
artery (image 64, series 8 and image 69 of series 12) is likely
motion artifact. If the patient has symptoms of pulmonary artery
embolism, dedicated PE CTA of the chest should be performed.

These results will be called to the ordering clinician or
representative by the Radiologist Assistant, and communication
documented in the PACS or [REDACTED].

## 2024-06-16 ENCOUNTER — Ambulatory Visit: Admitting: Nurse Practitioner

## 2024-06-16 ENCOUNTER — Ambulatory Visit: Payer: Self-pay | Admitting: Nurse Practitioner

## 2024-06-16 ENCOUNTER — Encounter: Payer: Self-pay | Admitting: Nurse Practitioner

## 2024-06-16 VITALS — BP 118/72 | HR 70 | Temp 98.3°F | Ht 64.5 in | Wt 129.2 lb

## 2024-06-16 DIAGNOSIS — M8589 Other specified disorders of bone density and structure, multiple sites: Secondary | ICD-10-CM

## 2024-06-16 DIAGNOSIS — I700 Atherosclerosis of aorta: Secondary | ICD-10-CM | POA: Diagnosis not present

## 2024-06-16 DIAGNOSIS — E039 Hypothyroidism, unspecified: Secondary | ICD-10-CM

## 2024-06-16 DIAGNOSIS — I10 Essential (primary) hypertension: Secondary | ICD-10-CM | POA: Diagnosis not present

## 2024-06-16 DIAGNOSIS — M810 Age-related osteoporosis without current pathological fracture: Secondary | ICD-10-CM

## 2024-06-16 DIAGNOSIS — R413 Other amnesia: Secondary | ICD-10-CM

## 2024-06-16 DIAGNOSIS — E782 Mixed hyperlipidemia: Secondary | ICD-10-CM

## 2024-06-16 DIAGNOSIS — R739 Hyperglycemia, unspecified: Secondary | ICD-10-CM | POA: Diagnosis not present

## 2024-06-16 DIAGNOSIS — E538 Deficiency of other specified B group vitamins: Secondary | ICD-10-CM

## 2024-06-16 DIAGNOSIS — R634 Abnormal weight loss: Secondary | ICD-10-CM

## 2024-06-16 LAB — COMPREHENSIVE METABOLIC PANEL WITH GFR
ALT: 15 U/L (ref 0–35)
AST: 20 U/L (ref 0–37)
Albumin: 4 g/dL (ref 3.5–5.2)
Alkaline Phosphatase: 63 U/L (ref 39–117)
BUN: 13 mg/dL (ref 6–23)
CO2: 32 meq/L (ref 19–32)
Calcium: 9.2 mg/dL (ref 8.4–10.5)
Chloride: 105 meq/L (ref 96–112)
Creatinine, Ser: 0.85 mg/dL (ref 0.40–1.20)
GFR: 69.22 mL/min (ref >60.00)
Glucose, Bld: 58 mg/dL — ABNORMAL LOW (ref 70–99)
Potassium: 4 meq/L (ref 3.5–5.1)
Sodium: 142 meq/L (ref 135–145)
Total Bilirubin: 0.9 mg/dL (ref 0.2–1.2)
Total Protein: 6.6 g/dL (ref 6.0–8.3)

## 2024-06-16 LAB — B12 AND FOLATE PANEL
Folate: 18.9 ng/mL (ref >5.9)
Vitamin B-12: 154 pg/mL — ABNORMAL LOW (ref 211–911)

## 2024-06-16 LAB — LIPID PANEL
Cholesterol: 130 mg/dL (ref 0–200)
HDL: 65.8 mg/dL (ref >39.00)
LDL Cholesterol: 51 mg/dL (ref 0–99)
NonHDL: 64.46
Total CHOL/HDL Ratio: 2
Triglycerides: 66 mg/dL (ref 0.0–149.0)
VLDL: 13.2 mg/dL (ref 0.0–40.0)

## 2024-06-16 LAB — VITAMIN D 25 HYDROXY (VIT D DEFICIENCY, FRACTURES): VITD: 34.48 ng/mL (ref 30.00–100.00)

## 2024-06-16 LAB — HEMOGLOBIN A1C: Hgb A1c MFr Bld: 5.6 % (ref 4.6–6.5)

## 2024-06-16 NOTE — Assessment & Plan Note (Signed)
 Repeat hgbA1c

## 2024-06-16 NOTE — Assessment & Plan Note (Signed)
 Repeat lipid panel. ?Maintain crestor dose ?

## 2024-06-16 NOTE — Assessment & Plan Note (Signed)
Repeat dexa scan
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
## 2024-06-16 NOTE — Patient Instructions (Signed)
 Go to lab Maintain Heart healthy diet and daily exercise. Maintain current medications.  Management of Memory Problems  There are some general things you can do to help manage your memory problems.  Your memory may not in fact recover, but by using techniques and strategies you will be able to manage your memory difficulties better.  1)  Establish a routine. Try to establish and then stick to a regular routine.  By doing this, you will get used to what to expect and you will reduce the need to rely on your memory.  Also, try to do things at the same time of day, such as taking your medication or checking your calendar first thing in the morning. Think about think that you can do as a part of a regular routine and make a list.  Then enter them into a daily planner to remind you.  This will help you establish a routine.  2)  Organize your environment. Organize your environment so that it is uncluttered.  Decrease visual stimulation.  Place everyday items such as keys or cell phone in the same place every day (ie.  Basket next to front door) Use post it notes with a brief message to yourself (ie. Turn off light, lock the door) Use labels to indicate where things go (ie. Which cupboards are for food, dishes, etc.) Keep a notepad and pen by the telephone to take messages  3)  Memory Aids A diary or journal/notebook/daily planner Making a list (shopping list, chore list, to do list that needs to be done) Using an alarm as a reminder (kitchen timer or cell phone alarm) Using cell phone to store information (Notes, Calendar, Reminders) Calendar/White board placed in a prominent position Post-it notes  In order for memory aids to be useful, you need to have good habits.  It's no good remembering to make a note in your journal if you don't remember to look in it.  Try setting aside a certain time of day to look in journal.  4)  Improving mood and managing fatigue. There may be other factors that  contribute to memory difficulties.  Factors, such as anxiety, depression and tiredness can affect memory. Regular gentle exercise can help improve your mood and give you more energy. Simple relaxation techniques may help relieve symptoms of anxiety Try to get back to completing activities or hobbies you enjoyed doing in the past. Learn to pace yourself through activities to decrease fatigue. Find out about some local support groups where you can share experiences with others. Try and achieve 7-8 hours of sleep at night.
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
## 2024-06-16 NOTE — Progress Notes (Signed)
 Established Patient Visit  Patient: Gwendolyn Sampson   DOB: 02/08/1951   73 y.o. Female  MRN: [id number] Visit Date: 06/16/2024  Subjective:    Chief Complaint  Patient presents with   Toe Injury    Left big toe nail no pain unless touched or messed with for a couple weeks    HPI Hypothyroidism Weight loss noted 37lbs in last 4months. Admts to decreased food intake and poor sleep due to financial difficulty after helping a friend (now deceased due to drug overdose). She had appointment with Dr. Tommas 35month ago who decreased levothyroxine dose to 50mcg. Denies any palpitations or ABDOMEN pain or change in BM or urination. Denies any difficulty getting food Denies need for psychiatry or psychology referral at this time. Wt Readings from Last 3 Encounters:  06/16/24 129 lb 3.2 oz (58.6 kg)  02/07/24 126 lb 12.8 oz (57.5 kg)  01/28/24 166 lb 10.7 oz (75.6 kg)    BP Readings from Last 3 Encounters:  06/16/24 118/72  02/07/24 106/74  01/28/24 (!) 149/99     Osteopenia of multiple sites Repeat dexa scan  Hyperglycemia Repeat hgbA1c  Hyperlipidemia Repeat lipid panel Maintain crestor  dose  Memory difficulty Onset 43months ago Described as difficulty with reading the time, intermittently forgets her way to familiar places, walks into a room and forgets what she came in there for. She lives alone, recent death of friend in her home due to drug overdose 43months ago, also facing financial difficulty due to attempt to help deceased friend (has assistance of an attorney and financial company to rectify financial problems).  She declined referral to psychiatry and psychology at this time. Check B12, folate, vit. D, RPR, cmp Unable to order HIV due to insurance coverage F/up in 3months  Difficulty reading time, intermittently forgets directions to go to familiar places x 43month. Has referral for neurocognitive evaluation  Wt Readings from Last 3 Encounters:   06/16/24 129 lb 3.2 oz (58.6 kg)  02/07/24 126 lb 12.8 oz (57.5 kg)  01/28/24 166 lb 10.7 oz (75.6 kg)    Reviewed medical, surgical, and social history today  Medications: Outpatient Medications Prior to Visit  Medication Sig   aspirin EC 81 MG tablet Take 81 mg by mouth daily.   ibuprofen  (ADVIL ) 600 MG tablet Take 1 tablet (600 mg total) by mouth every 8 (eight) hours as needed.   levothyroxine (SYNTHROID) 50 MCG tablet Take 50 mcg by mouth daily before breakfast.   propranolol (INDERAL) 20 MG tablet Take 20 mg by mouth daily.   rosuvastatin  (CRESTOR ) 10 MG tablet Take 1 tablet (10 mg total) by mouth daily.   Vitamin E 180 MG CAPS Take 180 mg by mouth daily. (Patient not taking: Reported on 06/16/2024)   [DISCONTINUED] Biotin 1000 MCG CHEW Chew 1,000 mcg by mouth daily. (Patient not taking: Reported on 06/16/2024)   [DISCONTINUED] Cholecalciferol (VITAMIN D-3) 25 MCG (1000 UT) CAPS Take by mouth. (Patient not taking: Reported on 06/16/2024)   [DISCONTINUED] diclofenac  Sodium (VOLTAREN ) 1 % GEL Apply 2 g topically 4 (four) times daily. (Patient not taking: Reported on 02/07/2024)   [DISCONTINUED] Docosahexaenoic Acid (DHA NATURAL OMEGA-3 PO) Take by mouth. (Patient not taking: Reported on 02/07/2024)   [DISCONTINUED] Krill Oil (OMEGA-3) 500 MG CAPS Take 500 mg by mouth daily. (Patient not taking: Reported on 06/16/2024)   [DISCONTINUED] methocarbamol  (ROBAXIN ) 500 MG tablet Take 1 tablet (500 mg total) by  mouth every 6 (six) hours as needed for muscle spasms. (Patient not taking: Reported on 06/16/2024)   [DISCONTINUED] multivitamin-lutein (OCUVITE-LUTEIN) CAPS capsule Take 1 capsule by mouth daily. (Patient not taking: Reported on 02/07/2024)   [DISCONTINUED] Nutritional Supplements (JUICE PLUS FIBRE PO) Take by mouth. (Patient not taking: Reported on 06/16/2024)   No facility-administered medications prior to visit.   Reviewed past medical and social history.   ROS per HPI above       Objective:  BP 118/72 (BP Location: Left Arm, Patient Position: Sitting, Cuff Size: Normal)   Pulse 70   Temp 98.3 F (36.8 C) (Oral)   Ht 5' 4.5 (1.638 m)   Wt 129 lb 3.2 oz (58.6 kg)   SpO2 98%   BMI 21.83 kg/m      Physical Exam Vitals and nursing note reviewed.  Cardiovascular:     Rate and Rhythm: Normal rate and regular rhythm.     Pulses: Normal pulses.     Heart sounds: Normal heart sounds.  Pulmonary:     Effort: Pulmonary effort is normal.     Breath sounds: Normal breath sounds.  Musculoskeletal:     Right lower leg: No edema.     Left lower leg: No edema.  Neurological:     Mental Status: She is alert and oriented to person, place, and time.     Cranial Nerves: No cranial nerve deficit.     Motor: No weakness.     Gait: Gait normal.  Psychiatric:        Mood and Affect: Mood normal.        Behavior: Behavior normal.        Thought Content: Thought content normal.     No results found for any visits on 06/16/24.    Assessment & Plan:    Problem List Items Addressed This Visit     Aortic atherosclerosis (HCC)   Relevant Orders   Lipid panel   Hyperglycemia   Repeat hgbA1c      Relevant Orders   Hemoglobin A1c   Hyperlipidemia   Repeat lipid panel Maintain crestor  dose      Relevant Orders   Lipid panel   Comprehensive metabolic panel with GFR   Hypothyroidism   Weight loss noted 37lbs in last 4months. Admts to decreased food intake and poor sleep due to financial difficulty after helping a friend (now deceased due to drug overdose). She had appointment with Dr. Tommas 66month ago who decreased levothyroxine dose to 50mcg. Denies any palpitations or ABDOMEN pain or change in BM or urination. Denies any difficulty getting food Denies need for psychiatry or psychology referral at this time. Wt Readings from Last 3 Encounters:  06/16/24 129 lb 3.2 oz (58.6 kg)  02/07/24 126 lb 12.8 oz (57.5 kg)  01/28/24 166 lb 10.7 oz (75.6 kg)    BP  Readings from Last 3 Encounters:  06/16/24 118/72  02/07/24 106/74  01/28/24 (!) 149/99         Memory difficulty   Onset 6months ago Described as difficulty with reading the time, intermittently forgets her way to familiar places, walks into a room and forgets what she came in there for. She lives alone, recent death of friend in her home due to drug overdose 6months ago, also facing financial difficulty due to attempt to help deceased friend (has assistance of an attorney and financial company to rectify financial problems).  She declined referral to psychiatry and psychology at this time. Check B12, folate,  vit. D, RPR, cmp Unable to order HIV due to insurance coverage F/up in 3months      Relevant Orders   B12 and Folate Panel   RPR   Osteopenia of multiple sites   Repeat dexa scan      Relevant Orders   DG Bone Density   VITAMIN D 25 Hydroxy (Vit-D Deficiency, Fractures)   Primary hypertension - Primary   Other Visit Diagnoses       Weight loss, non-intentional          Return in about 3 months (around 09/16/2024) for depressiona nd memory deficit.     Roselie Mood, NP

## 2024-06-17 LAB — RPR: RPR Ser Ql: NONREACTIVE

## 2024-06-19 ENCOUNTER — Telehealth: Payer: Self-pay | Admitting: Nurse Practitioner

## 2024-06-19 NOTE — Telephone Encounter (Signed)
 I have not scheduled nurse visit yet.

## 2024-06-19 NOTE — Telephone Encounter (Signed)
 Called patient and informed he of Gwendolyn Sampson's recommendation of starting the weekly B12 injections for 4 week, then switch to monthly B12 injections until next appointment in 3 months. She thanked me calling and explaining what she needed to do.

## 2024-06-19 NOTE — Telephone Encounter (Signed)
 Copied from CRM [id number]. Topic: General - Call Back - No Documentation >> Jun 19, 2024 10:10 AM Rea C wrote: Reason for CRM: Patient called in to schedule weekly B12 shots. Patient would like clarification if the switch to monthly B12 shots are once a month or not before she schedules those.   Patient contact is [phone] (M).

## 2024-06-20 ENCOUNTER — Ambulatory Visit (INDEPENDENT_AMBULATORY_CARE_PROVIDER_SITE_OTHER)

## 2024-06-20 DIAGNOSIS — E538 Deficiency of other specified B group vitamins: Secondary | ICD-10-CM | POA: Diagnosis not present

## 2024-06-20 MED ORDER — CYANOCOBALAMIN 1000 MCG/ML IJ SOLN
1000.0000 ug | Freq: Once | INTRAMUSCULAR | Status: DC
Start: 2024-06-20 — End: 2024-06-20
  Administered 2024-06-20: 1000 ug via INTRAMUSCULAR

## 2024-06-20 NOTE — Progress Notes (Signed)
 Patient is in office today for a nurse visit  B12 Injection. Patient Injection was given in the  Left deltoid. Patient tolerated injection well.

## 2024-06-27 ENCOUNTER — Ambulatory Visit (INDEPENDENT_AMBULATORY_CARE_PROVIDER_SITE_OTHER)

## 2024-06-27 DIAGNOSIS — E538 Deficiency of other specified B group vitamins: Secondary | ICD-10-CM

## 2024-06-27 MED ORDER — CYANOCOBALAMIN 1000 MCG/ML IJ SOLN
1000.0000 ug | Freq: Once | INTRAMUSCULAR | Status: AC
Start: 2024-06-27 — End: 2024-06-27
  Administered 2024-06-27: 1000 ug via INTRAMUSCULAR

## 2024-06-27 NOTE — Progress Notes (Signed)
 Pt here for weekly B12 injection per orders and notes of Roselie Mood, NP.   B12 was given in left deltoid region by Laymon Gladis Sharps, CMA and pt tolerated injection well. Next appointment is scheduled for July 04, 2024 for next B-12 injection.

## 2024-07-04 ENCOUNTER — Ambulatory Visit (INDEPENDENT_AMBULATORY_CARE_PROVIDER_SITE_OTHER)

## 2024-07-04 DIAGNOSIS — E538 Deficiency of other specified B group vitamins: Secondary | ICD-10-CM | POA: Diagnosis not present

## 2024-07-04 MED ORDER — CYANOCOBALAMIN 1000 MCG/ML IJ SOLN
1000.0000 ug | Freq: Once | INTRAMUSCULAR | Status: AC
Start: 2024-07-04 — End: 2024-07-04
  Administered 2024-07-04 (×2): 1000 ug via INTRAMUSCULAR

## 2024-07-04 NOTE — Progress Notes (Addendum)
 Pt here for weekly  B12 injection per Roselie Peek, NP.   B12 1000mcg given IM  left deltoid and pt tolerated injection well.   Next B12 injection scheduled for 07/11/2024.  Angie

## 2024-07-11 ENCOUNTER — Ambulatory Visit (INDEPENDENT_AMBULATORY_CARE_PROVIDER_SITE_OTHER)

## 2024-07-11 DIAGNOSIS — E538 Deficiency of other specified B group vitamins: Secondary | ICD-10-CM

## 2024-07-11 MED ORDER — CYANOCOBALAMIN 1000 MCG/ML IJ SOLN
1000.0000 ug | Freq: Once | INTRAMUSCULAR | Status: AC
Start: 2024-07-11 — End: 2024-07-11
  Administered 2024-07-11: 1000 ug via INTRAMUSCULAR

## 2024-07-11 NOTE — Progress Notes (Signed)
 Pt here for weekly B12 injection per Roselie Mood, NP  B12 was given in left deltoid IM by Laymon Gladis Sharps, CMA and pt tolerated injection well.  Next B12 injection scheduled for August 11, 2024.

## 2024-07-31 ENCOUNTER — Other Ambulatory Visit: Payer: Self-pay | Admitting: Nurse Practitioner

## 2024-07-31 DIAGNOSIS — E785 Hyperlipidemia, unspecified: Secondary | ICD-10-CM

## 2024-07-31 NOTE — Telephone Encounter (Signed)
 Copied from CRM #[id number]. Topic: Clinical - Medication Refill >> Jul 31, 2024  9:14 AM Pinkey ORN wrote: Medication: rosuvastatin  (CRESTOR ) 10 MG tablet  Has the patient contacted their pharmacy? Yes (Agent: If no, request that the patient contact the pharmacy for the refill. If patient does not wish to contact the pharmacy document the reason why and proceed with request.) (Agent: If yes, when and what did the pharmacy advise?)  This is the patient's preferred pharmacy:  [id number] [place] - [place], [place] - [id number] [place]. AT [facility] [address] [place] [address] Phone: [phone] Fax: [phone]  Is this the correct pharmacy for this prescription? Yes If no, delete pharmacy and type the correct one.   Has the prescription been filled recently? No  Is the patient out of the medication? Yes  Has the patient been seen for an appointment in the last year OR does the patient have an upcoming appointment? Yes  Can we respond through MyChart? Yes  Agent: Please be advised that Rx refills may take up to 3 business days. We ask that you follow-up with your pharmacy.

## 2024-08-02 MED ORDER — ROSUVASTATIN CALCIUM 10 MG PO TABS: 10.0000 mg | ORAL_TABLET | Freq: Every day | ORAL | 0 refills | Status: DC

## 2024-08-11 ENCOUNTER — Ambulatory Visit (INDEPENDENT_AMBULATORY_CARE_PROVIDER_SITE_OTHER)

## 2024-08-11 ENCOUNTER — Ambulatory Visit

## 2024-08-11 DIAGNOSIS — E538 Deficiency of other specified B group vitamins: Secondary | ICD-10-CM

## 2024-08-11 MED ORDER — CYANOCOBALAMIN 1000 MCG/ML IJ SOLN
1000.0000 ug | Freq: Once | INTRAMUSCULAR | Status: AC
Start: 2024-08-11 — End: 2024-08-11
  Administered 2024-08-11: 1000 ug via INTRAMUSCULAR

## 2024-08-11 NOTE — Progress Notes (Signed)
 Pt here for monthly B12 injection per Charlotte Nche, NP  B12 1000mcg given left IM and pt tolerated injection well.  Next B12 injection scheduled for 1 month.  Pt started her monthly injections today. Completed weekly.

## 2024-09-11 ENCOUNTER — Ambulatory Visit: Admitting: Nurse Practitioner

## 2024-09-12 ENCOUNTER — Ambulatory Visit: Admitting: Nurse Practitioner

## 2024-09-12 ENCOUNTER — Encounter: Payer: Self-pay | Admitting: Nurse Practitioner

## 2024-09-12 VITALS — BP 102/68 | HR 70 | Temp 97.5°F | Ht 64.0 in | Wt 128.6 lb

## 2024-09-12 DIAGNOSIS — Z23 Encounter for immunization: Secondary | ICD-10-CM | POA: Diagnosis not present

## 2024-09-12 DIAGNOSIS — R739 Hyperglycemia, unspecified: Secondary | ICD-10-CM | POA: Diagnosis not present

## 2024-09-12 DIAGNOSIS — E039 Hypothyroidism, unspecified: Secondary | ICD-10-CM

## 2024-09-12 DIAGNOSIS — R413 Other amnesia: Secondary | ICD-10-CM

## 2024-09-12 DIAGNOSIS — E538 Deficiency of other specified B group vitamins: Secondary | ICD-10-CM

## 2024-09-12 LAB — T4, FREE: Free T4: 1.03 ng/dL (ref 0.60–1.60)

## 2024-09-12 LAB — VITAMIN B12: Vitamin B-12: >1500 pg/mL — ABNORMAL HIGH (ref 211–911)

## 2024-09-12 LAB — TSH: TSH: 1.78 u[IU]/mL (ref 0.35–5.50)

## 2024-09-12 MED ORDER — CYANOCOBALAMIN 1000 MCG/ML IJ SOLN
1000.0000 ug | Freq: Once | INTRAMUSCULAR | Status: AC
Start: 2024-09-12 — End: 2024-09-12
  Administered 2024-09-12: 1000 ug via INTRAMUSCULAR

## 2024-09-12 NOTE — Patient Instructions (Addendum)
 Go to lab Schedule nurse visit for another B12 injection in 12month May use melatonin for sleep maintenance Have mammogram report faxed to me Schedule appointment with GI for repeat colonoscopy. Maintain current med doses

## 2024-09-12 NOTE — Assessment & Plan Note (Signed)
 She has complete weekly B12 injection x 4weeks. She is currently taking monthly injections, She is due for an injection today. Repeat B12 level today

## 2024-09-12 NOTE — Progress Notes (Signed)
 Per orders of Roselie Mood, NP, injection of B-12 given by Joeseph Piety in right deltoid. Patient tolerated injection well. Patient will make appointment for 1 month.

## 2024-09-12 NOTE — Assessment & Plan Note (Signed)
Repeat Tsh and T4

## 2024-09-12 NOTE — Progress Notes (Signed)
 Established Patient Visit  Patient: Gwendolyn Sampson   DOB: 1951/08/01   73 y.o. Female  MRN: [id number] Visit Date: 09/12/2024  Subjective:    Chief Complaint  Patient presents with   Follow-up    3 month follow up for depression and memory deficit    HPI Memory difficulty MMSE completed today 19/33 Normal folate, negative RPR, B12 deficient Unable to complete HIV due to insurance coverage She admits to short term memory deficit, remembering numbers and reading her watch, worse with recent financial difficulty. Associated with poor maintenance and increased anxiety She states she discussed symptoms with her neurologist (Dr. Rosalia with [facility]) 4months ago. She is waiting for call from that office for cognitive function evaluation. Upon review of Neurology 04/2024 note, a carotid doppler was ordered but not completed. She had and MRI brain 09/2022-No acute or reversible finding. No post traumatic finding. Mild age related volume loss. Single old T2 and FLAIR bright focus within the left parietal white matter.   Advised to call neurology about neuropsychology evaluation and carotid doppler She declined need for any medication for sleep or anxiety She declined referral to psychologist  Hypothyroidism Repeat Tsh and T4  B12 deficiency She has complete weekly B12 injection x 4weeks. She is currently taking monthly injections, She is due for an injection today. Repeat B12 level today     09/12/2024    2:22 PM  MMSE - Mini Mental State Exam  Orientation to time 5  Orientation to Place 5  Registration 3  Attention/ Calculation 0  Recall 0  Language- name 2 objects 2  Language- repeat 1  Language- follow 3 step command 1  Language- read & follow direction 1  Write a sentence 1  Copy design 0  Total score 19    Wt Readings from Last 3 Encounters:  09/12/24 128 lb 9.6 oz (58.3 kg)  06/16/24 129 lb 3.2 oz (58.6 kg)  02/07/24 126 lb 12.8 oz (57.5 kg)        09/12/2024    2:22 PM  MMSE - Mini Mental State Exam  Orientation to time 5  Orientation to Place 5  Registration 3  Attention/ Calculation 0  Recall 0  Language- name 2 objects 2  Language- repeat 1  Language- follow 3 step command 1  Language- read & follow direction 1  Write a sentence 1  Copy design 0  Total score 19    Reviewed medical, surgical, and social history today  Medications: Outpatient Medications Prior to Visit  Medication Sig   aspirin EC 81 MG tablet Take 81 mg by mouth daily.   cyanocobalamin  (VITAMIN B12) 1000 MCG/ML injection weekly B12 injections x 4, then switch to monthly B12 injections till next appointment in 3months   ibuprofen  (ADVIL ) 600 MG tablet Take 1 tablet (600 mg total) by mouth every 8 (eight) hours as needed.   levothyroxine (SYNTHROID) 50 MCG tablet Take 50 mcg by mouth daily before breakfast.   Multiple Vitamins-Minerals (PRESERVISION AREDS PO) Take 2 capsules by mouth daily.   propranolol (INDERAL) 20 MG tablet Take 20 mg by mouth daily. (Patient taking differently: Take 20 mg by mouth as needed.)   propranolol ER (INDERAL LA) 60 MG 24 hr capsule Take 60 mg by mouth daily.   rosuvastatin  (CRESTOR ) 10 MG tablet Take 1 tablet (10 mg total) by mouth daily.   No facility-administered medications prior to visit.  Reviewed past medical and social history.   ROS per HPI above  Last CBC Lab Results  Component Value Date   WBC 6.8 12/11/2021   HGB 14.3 12/11/2021   HCT 43.9 12/11/2021   MCV 88.0 12/11/2021   RDW 13.6 12/11/2021   PLT 201.0 12/11/2021   Last metabolic panel Lab Results  Component Value Date   GLUCOSE 58 (L) 06/16/2024   NA 142 06/16/2024   K 4.0 06/16/2024   CL 105 06/16/2024   CO2 32 06/16/2024   BUN 13 06/16/2024   CREATININE 0.85 06/16/2024   GFR 69.22 06/16/2024   CALCIUM  9.2 06/16/2024   PROT 6.6 06/16/2024   ALBUMIN 4.0 06/16/2024   BILITOT 0.9 06/16/2024   ALKPHOS 63 06/16/2024   AST 20  06/16/2024   ALT 15 06/16/2024   Last lipids Lab Results  Component Value Date   CHOL 130 06/16/2024   HDL 65.80 06/16/2024   LDLCALC 51 06/16/2024   TRIG 66.0 06/16/2024   CHOLHDL 2 06/16/2024   Last hemoglobin A1c Lab Results  Component Value Date   HGBA1C 5.6 06/16/2024   Last thyroid  functions No results found for: TSH, T3TOTAL, T4TOTAL, FREET4, THYROIDAB Last vitamin D  Lab Results  Component Value Date   VD25OH 34.48 06/16/2024   Last vitamin B12 and Folate Lab Results  Component Value Date   VITAMINB12 154 (L) 06/16/2024   FOLATE 18.9 06/16/2024        Objective:  BP 102/68 (BP Location: Left Arm, Patient Position: Sitting, Cuff Size: Normal)   Pulse 70   Temp (!) 97.5 F (36.4 C) (Oral)   Ht 5' 4 (1.626 m)   Wt 128 lb 9.6 oz (58.3 kg)   SpO2 98%   BMI 22.07 kg/m      Physical Exam Vitals and nursing note reviewed.  Neurological:     Mental Status: She is alert.  Psychiatric:        Mood and Affect: Affect normal. Mood is depressed.        Speech: Speech normal.        Behavior: Behavior is cooperative.        Thought Content: Thought content normal.        Cognition and Memory: Memory is impaired.     No results found for any visits on 09/12/24.    Assessment & Plan:    Problem List Items Addressed This Visit     B12 deficiency - Primary   She has complete weekly B12 injection x 4weeks. She is currently taking monthly injections, She is due for an injection today. Repeat B12 level today      Relevant Medications   cyanocobalamin  (VITAMIN B12) injection 1,000 mcg   Other Relevant Orders   B12   Hyperglycemia   Hypothyroidism   Repeat Tsh and T4      Relevant Medications   propranolol ER (INDERAL LA) 60 MG 24 hr capsule   Other Relevant Orders   T4, free   TSH   Memory difficulty   MMSE completed today 19/33 Normal folate, negative RPR, B12 deficient Unable to complete HIV due to insurance coverage She admits to  short term memory deficit, remembering numbers and reading her watch, worse with recent financial difficulty. Associated with poor maintenance and increased anxiety She states she discussed symptoms with her neurologist (Dr. Rosalia with [facility]) 4months ago. She is waiting for call from that office for cognitive function evaluation. Upon review of Neurology 04/2024 note, a carotid doppler  was ordered but not completed. She had and MRI brain 09/2022-No acute or reversible finding. No post traumatic finding. Mild age related volume loss. Single old T2 and FLAIR bright focus within the left parietal white matter.   Advised to call neurology about neuropsychology evaluation and carotid doppler She declined need for any medication for sleep or anxiety She declined referral to psychologist      Other Visit Diagnoses       Immunization due       Relevant Orders   Flu vaccine HIGH DOSE PF(Fluzone Trivalent) (Completed)      Return in about 3 months (around 12/13/2024) for anxiety, weight loss, hyperlipidemia (fasting).     Roselie Mood, NP

## 2024-09-12 NOTE — Assessment & Plan Note (Addendum)
 MMSE completed today 19/33 Normal folate, negative RPR, B12 deficient Unable to complete HIV due to insurance coverage She admits to short term memory deficit, remembering numbers and reading her watch, worse with recent financial difficulty. Associated with poor maintenance and increased anxiety She states she discussed symptoms with her neurologist (Dr. Rosalia with [facility]) 4months ago. She is waiting for call from that office for cognitive function evaluation. Upon review of Neurology 04/2024 note, a carotid doppler was ordered but not completed. She had and MRI brain 09/2022-No acute or reversible finding. No post traumatic finding. Mild age related volume loss. Single old T2 and FLAIR bright focus within the left parietal white matter.   Advised to call neurology about neuropsychology evaluation and carotid doppler She declined need for any medication for sleep or anxiety She declined referral to psychologist

## 2024-09-13 ENCOUNTER — Ambulatory Visit: Payer: Self-pay | Admitting: Nurse Practitioner

## 2024-09-13 DIAGNOSIS — E538 Deficiency of other specified B group vitamins: Secondary | ICD-10-CM

## 2024-09-13 MED ORDER — VITAMIN B-12 1000 MCG PO TABS
1000.0000 ug | ORAL_TABLET | Freq: Every day | ORAL | 1 refills | Status: AC
Start: 2024-09-13 — End: ?

## 2024-09-13 NOTE — Assessment & Plan Note (Signed)
 Normal B12 level, ok to stop IM B12. Start oral B12supplement. Prescription sent. Repeat labs in 3-58months

## 2024-09-14 ENCOUNTER — Ambulatory Visit (HOSPITAL_BASED_OUTPATIENT_CLINIC_OR_DEPARTMENT_OTHER)
Admission: RE | Admit: 2024-09-14 | Discharge: 2024-09-14 | Disposition: A | Discharge status: Home / Self Care | Source: Ambulatory Visit | Attending: Nurse Practitioner | Admitting: Nurse Practitioner

## 2024-09-14 DIAGNOSIS — M8589 Other specified disorders of bone density and structure, multiple sites: Secondary | ICD-10-CM | POA: Diagnosis present

## 2024-09-17 MED ORDER — DENOSUMAB 60 MG/ML ~~LOC~~ SOSY
60.0000 mg | PREFILLED_SYRINGE | SUBCUTANEOUS | 1 refills | Status: DC
Start: 2024-09-17 — End: 2024-10-04

## 2024-09-17 NOTE — Addendum Note (Signed)
 Addended by: KATHEEN ROSELIE CROME on: 09/17/2024 05:43 PM   Modules accepted: Orders

## 2024-09-18 LAB — HM MAMMOGRAPHY

## 2024-09-19 ENCOUNTER — Encounter: Payer: Self-pay | Admitting: Nurse Practitioner

## 2024-09-20 ENCOUNTER — Encounter: Payer: Self-pay | Admitting: Nurse Practitioner

## 2024-09-20 DIAGNOSIS — M810 Age-related osteoporosis without current pathological fracture: Secondary | ICD-10-CM

## 2024-09-21 ENCOUNTER — Encounter: Payer: Self-pay | Admitting: Nurse Practitioner

## 2024-09-26 ENCOUNTER — Telehealth: Payer: Self-pay

## 2024-09-26 NOTE — Progress Notes (Unsigned)
 Complex Care Management Note Care Guide Note  09/26/2024 Name: Mersadies Petree MRN: [id number] DOB: Nov 15, 1951   Complex Care Management Outreach Attempts: An unsuccessful telephone outreach was attempted today to offer the patient information about available complex care management services.  Follow Up Plan:  Additional outreach attempts will be made to offer the patient complex care management information and services.   Encounter Outcome:  No Answer  Dreama [facility]  [facility], [facility] VBCI Assistant Direct Dial: [phone]  Fax: [phone]

## 2024-09-27 ENCOUNTER — Encounter: Payer: Self-pay | Admitting: Gastroenterology

## 2024-09-27 NOTE — Progress Notes (Signed)
 Complex Care Management Note  Care Guide Note 09/27/2024 Name: Gwendolyn Sampson MRN: [id number] DOB: 04-21-1951  Gwendolyn Sampson is a 73 y.o. year old female who sees Nche, Roselie Rockford, NP for primary care. I reached out to [employer] by phone today to offer complex care management services.  [name] was given information about Complex Care Management services today including:   The Complex Care Management services include support from the care team which includes your Nurse Care Manager, Clinical Social Worker, or Pharmacist.  The Complex Care Management team is here to help remove barriers to the health concerns and goals most important to you. Complex Care Management services are voluntary, and the patient may decline or stop services at any time by request to their care team member.   Complex Care Management Consent Status: Patient agreed to services and verbal consent obtained.   Follow up plan:  Telephone appointment with complex care management team member scheduled for:  10/02/24 at 10:00 a.m.   Encounter Outcome:  Patient Scheduled  Dreama [facility]  [facility], [facility] VBCI Assistant Direct Dial: [phone]  Fax: [phone]

## 2024-10-02 ENCOUNTER — Other Ambulatory Visit: Payer: Self-pay

## 2024-10-02 DIAGNOSIS — M810 Age-related osteoporosis without current pathological fracture: Secondary | ICD-10-CM

## 2024-10-02 NOTE — Progress Notes (Unsigned)
 10/02/2024 Name: Gwendolyn Sampson MRN: [id number] DOB: 01/03/1951  Chief Complaint  Patient presents with   Medication Assistance   Gwendolyn Sampson is a 73 y.o. year old female who presented for a telephone visit.   They were referred to the pharmacist by their PCP for assistance in managing osteoporosis .    Subjective:  Care Team: Primary Care Provider: Katheen Roselie Rockford, NP   Medication Access/Adherence  Current Pharmacy:  [facility] [address] [place], [place] - [address] [facility] AT [address] [place]. [place] [address] Phone: [phone] Fax: [phone]  -Patient reports affordability concerns with their medications: Yes  -Patient reports access/transportation concerns to their pharmacy: No  -Patient reports adherence concerns with their medications:  Yes    Osteoporosis: Current medications: none -Medications tried in the past: none -Prescribed Prolia 60mg  every 6 months, but patient's copay on insurance would be $900, which is not affordable -Current supplements: Calcium  600mg , Vitamin D3 1000 units daily -Most recent DEXA: 09/14/2024 reflects the following T scores -2 Lumbar Spine -2.4 L Femoral Neck -2 Total Hip -2.9 R Femoral Neck -2.1 Total Hip  Objective: Lab Results  Component Value Date   CREATININE 0.85 06/16/2024   BUN 13 06/16/2024   NA 142 06/16/2024   K 4.0 06/16/2024   CL 105 06/16/2024   CO2 32 06/16/2024   Medications Reviewed Today     Reviewed by Deanna Channing LABOR, RPH (Pharmacist) on 10/02/24 at 1016  Med List Status: <None>   Medication Order Taking? Sig Documenting Provider Last Dose Status Informant  aspirin EC 81 MG tablet [id number]  Take 81 mg by mouth daily. [provider]  Active   cyanocobalamin  (VITAMIN B12) 1000 MCG tablet [id number] Yes Take 1 tablet (1,000 mcg total) by mouth daily. Nche, Roselie Rockford, NP  Active   denosumab (PROLIA) 60 MG/ML SOSY injection  [id number]  Inject 60 mg into the skin every 6 (six) months.  Patient not taking: Reported on 10/02/2024   Nche, Roselie Rockford, NP  Active   ibuprofen  (ADVIL ) 600 MG tablet [id number] Yes Take 1 tablet (600 mg total) by mouth every 8 (eight) hours as needed. Sebastian Beverley NOVAK, MD  Active   levothyroxine (SYNTHROID) 50 MCG tablet [id number] Yes Take 50 mcg by mouth daily before breakfast. [provider]  Active   Multiple Vitamins-Minerals (PRESERVISION AREDS PO) [id number] Yes Take 2 capsules by mouth daily. [provider]  Active    Patient taking differently:   Discontinued 10/02/24 1015   propranolol ER (INDERAL LA) 60 MG 24 hr capsule [id number] Yes Take 60 mg by mouth daily. [provider]  Active   rosuvastatin  (CRESTOR ) 10 MG tablet [id number] Yes Take 1 tablet (10 mg total) by mouth daily. Sebastian Beverley NOVAK, MD  Active            Assessment/Plan:   Osteoporosis: -Currently inappropriately managed/opportunity for optimization -Continue Calcium  600 mg and Vitamin D3 1000 units daily -Patient does not qualify for LIS Medicare Extra Help based on HHI -Does not qualify for [employer] Safety Net PAP since insurance does cover Prolia (even though copay is not affordable) -[facility] is currently closed -Recommend sending plan to infusion center, so they can check Medicare B coverage for Prolia versus billing to Part D at pharmacy.  Once plan order signed by PCP, team will check coverage/cost of Prolia, notify patient, and move forward with scheduling if she  would like. -If Prolia is not affordable on Medicare B, we can look into more affordable treatment options if needed (such as oral bisphosphonate)  Follow Up Plan: Sending MyChart message to patient to inform her she could be contacted in regard to pricing of Prolia through Medicare B and providing my direct number, so she can follow-up with me if this will not be an affordable option.  Channing DELENA Mealing, PharmD, DPLA

## 2024-10-03 ENCOUNTER — Encounter: Payer: Self-pay | Admitting: Nurse Practitioner

## 2024-10-03 ENCOUNTER — Telehealth (HOSPITAL_COMMUNITY): Payer: Self-pay

## 2024-10-03 ENCOUNTER — Other Ambulatory Visit (HOSPITAL_COMMUNITY): Payer: Self-pay | Admitting: Nurse Practitioner

## 2024-10-03 DIAGNOSIS — M810 Age-related osteoporosis without current pathological fracture: Secondary | ICD-10-CM | POA: Insufficient documentation

## 2024-10-03 NOTE — Telephone Encounter (Signed)
 Auth Submission: NO AUTH NEEDED Site of care: Site of care: [facility] Payer: Medicare A/B Medication & CPT/J Code(s) submitted: Prolia (Denosumab) [id number] Diagnosis Code: M81.0 Route of submission (phone, fax, portal):  Phone # Fax # Auth type: Buy/Bill HB Units/visits requested: 60mg  q65months Reference number:  Approval from: 10/03/24 to 12/23/24

## 2024-10-04 ENCOUNTER — Telehealth: Payer: Self-pay | Admitting: Nurse Practitioner

## 2024-10-04 DIAGNOSIS — M810 Age-related osteoporosis without current pathological fracture: Secondary | ICD-10-CM

## 2024-10-04 MED ORDER — ALENDRONATE SODIUM 70 MG PO TABS: 70.0000 mg | ORAL_TABLET | ORAL | 3 refills | Status: AC

## 2024-10-04 NOTE — Telephone Encounter (Signed)
 Tried calling patient and the line got disconnected. I sent a MyChart message to patient with provider comments.

## 2024-10-04 NOTE — Telephone Encounter (Signed)
-----   Message from Gwendolyn Sampson sent at 10/03/2024 12:57 PM EST ----- Osteoporosis: -Currently inappropriately managed/opportunity for optimization -Continue Calcium  600 mg and Vitamin D3 1000 units daily -Patient does not qualify for LIS Medicare Extra Help based on HHI -Does not qualify for [employer] Safety Net PAP since insurance does cover Prolia (even though copay is not affordable) -[facility] is currently closed -Recommend sending plan to infusion center, so they can check Medicare B coverage for Prolia versus billing to Part D at pharmacy.  Once plan order signed by PCP, team will check coverage/cost of Prolia, notify patient, and move forward with scheduling if she would like. -If Prolia is not affordable on Medicare B, we can look into more affordable treatment options if needed (such as oral bisphosphonate)  Follow Up Plan: Sending MyChart message to patient to inform her she could be contacted in regard to pricing of Prolia through Medicare B and providing my direct number, so she can follow-up with me if this option is not affordable

## 2024-10-05 ENCOUNTER — Telehealth: Payer: Self-pay

## 2024-10-05 NOTE — Progress Notes (Signed)
   10/05/2024  Patient ID: Gwendolyn Sampson, female   DOB: 05-02-1951, 73 y.o.   MRN: [id number]  Patient outreach to inform her that Roselie Mood, NP, has sent in alendronate 70mg  weekly to replace Prolia based on affordability.  Reviewed use and administration of the medication with the patient, and she endorsed understanding.    Channing DELENA Mealing, PharmD, DPLA

## 2024-10-25 ENCOUNTER — Other Ambulatory Visit: Payer: Self-pay | Admitting: Family Medicine

## 2024-10-25 DIAGNOSIS — E785 Hyperlipidemia, unspecified: Secondary | ICD-10-CM

## 2024-10-31 ENCOUNTER — Encounter

## 2024-11-14 ENCOUNTER — Encounter: Admitting: Gastroenterology

## 2024-11-27 ENCOUNTER — Ambulatory Visit (INDEPENDENT_AMBULATORY_CARE_PROVIDER_SITE_OTHER): Payer: Medicare Other

## 2024-11-27 DIAGNOSIS — Z0000 Encounter for general adult medical examination without abnormal findings: Secondary | ICD-10-CM | POA: Diagnosis not present

## 2024-11-27 NOTE — Progress Notes (Signed)
 "  Chief Complaint  Patient presents with   Medicare Wellness     Subjective:   Gwendolyn Sampson is a 74 y.o. female who presents for a Medicare Annual Wellness Visit.  Visit info / Clinical Intake: Medicare Wellness Visit Type:: Subsequent Annual Wellness Visit Persons participating in visit and providing information:: patient Medicare Wellness Visit Mode:: Telephone If telephone:: video declined Since this visit was completed virtually, some vitals may be partially provided or unavailable. Missing vitals are due to the limitations of the virtual format.: Unable to obtain vitals - no equipment If Telephone or Video please confirm:: I connected with patient using audio/video enable telemedicine. I verified patient identity with two identifiers, discussed telehealth limitations, and patient agreed to proceed. Patient Location:: home Provider Location:: office Interpreter Needed?: No Pre-visit prep was completed: no AWV questionnaire completed by patient prior to visit?: no Living arrangements:: (!) lives alone Patient's Overall Health Status Rating: excellent Typical amount of pain: none Does pain affect daily life?: no Are you currently prescribed opioids?: no  Dietary Habits and Nutritional Risks How many meals a day?: 2 Eats fruit and vegetables daily?: (!) no Most meals are obtained by: preparing own meals In the last 2 weeks, have you had any of the following?: none Diabetic:: no  Functional Status Activities of Daily Living (to include ambulation/medication): Independent Ambulation: Independent Medication Administration: Independent Home Management (perform basic housework or laundry): Independent Manage your own finances?: yes Primary transportation is: driving Concerns about vision?: no *vision screening is required for WTM* Concerns about hearing?: no  Fall Screening Falls in the past year?: 0 Number of falls in past year: 0 Was there an injury with Fall?: 0 Fall  Risk Category Calculator: 0 Patient Fall Risk Level: Low Fall Risk  Fall Risk Patient at Risk for Falls Due to: Medication side effect Fall risk Follow up: Falls prevention discussed; Falls evaluation completed  Home and Transportation Safety: All rugs have non-skid backing?: yes All stairs or steps have railings?: yes Grab bars in the bathtub or shower?: yes Have non-skid surface in bathtub or shower?: yes Good home lighting?: yes Regular seat belt use?: yes Hospital stays in the last year:: no  Cognitive Assessment Difficulty concentrating, remembering, or making decisions? : no Will 6CIT or Mini Cog be Completed: yes Was the patient able to repeat memory words in 3 tries?: no (repeated all 3 the first time second time only repeated 1 word third time could not remember the word) Which version was used?: Version1 : banana, sunrise, chair Clock numbers correct?: no (the patient wrote the number 12 twice then drew three hands for the clock without being asked) Normal clock drawing test?: 2 How many words correct?: 1 Which version was used?: Version 1: banana, sunrise, chair Mini-Cog Scoring: 3 What year is it?: 0 points What month is it?: 0 points Give patient an address phrase to remember (5 components): [address] [place] About what time is it?: 3 points Count backwards from 20 to 1: 0 points Say the months of the year in reverse: 0 points Repeat the address phrase from earlier: 10 points 6 CIT Score: 13 points  Advance Directives (For Healthcare) Does Patient Have a Medical Advance Directive?: Yes Type of Advance Directive: Healthcare Power of [place]; Living will Copy of Healthcare Power of Attorney in Chart?: Yes - validated most recent copy scanned in chart (See row information) Copy of Living Will in Chart?: Yes - validated most recent copy scanned in chart (See  row information) Would patient like information on creating a medical advance directive?: No - Patient  declined  Reviewed/Updated  Reviewed/Updated: Reviewed All (Medical, Surgical, Family, Medications, Allergies, Care Teams, Patient Goals)    Allergies (verified) Other   Current Medications (verified) Outpatient Encounter Medications as of 11/27/2024  Medication Sig   alendronate  (FOSAMAX ) 70 MG tablet Take 1 tablet (70 mg total) by mouth every 7 (seven) days. Take with a full glass of water on an empty stomach.   aspirin EC 81 MG tablet Take 81 mg by mouth daily.   Calcium  Carb-Cholecalciferol (CALCIUM  600+D3 PO) Take 1 tablet by mouth daily.   cyanocobalamin  (VITAMIN B12) 1000 MCG tablet Take 1 tablet (1,000 mcg total) by mouth daily.   ibuprofen  (ADVIL ) 600 MG tablet Take 1 tablet (600 mg total) by mouth every 8 (eight) hours as needed.   levothyroxine (SYNTHROID) 50 MCG tablet Take 50 mcg by mouth daily before breakfast.   Multiple Vitamins-Minerals (PRESERVISION AREDS PO) Take 2 capsules by mouth daily.   propranolol ER (INDERAL LA) 60 MG 24 hr capsule Take 60 mg by mouth daily.   rosuvastatin  (CRESTOR ) 10 MG tablet TAKE ONE TABLET BY MOUTH ONE TIME DAILY   No facility-administered encounter medications on file as of 11/27/2024.    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 No typical groundglass type peripheral infiltrates to suggest viral pneumonia 2. Peripheral opacities in all lobes of both lungs with an upper lobe predominance, presumed fibrosis given the history of interstitial lung disease. - Covid IgG   03/31/2019  Positive   - 04/19/2019   Walked RA  2 laps @  approx 244ft each @ very fast pace   stopped due to   MRSA (methicillin resistant Staphylococcus aureus) 2010   wrist, waist, buttock   Neuromuscular disorder (HCC)    Past Surgical History:  Procedure Laterality Date   EYE SURGERY     cataract   LIPOMA EXCISION     on bacl   STRABISMUS SURGERY Bilateral 10/22/2017   Procedure: REPAIR STRABISMUS BILATERAL;  Surgeon: Neysa Fallow, MD;  Location: [facility];  Service: Ophthalmology;  Laterality: Bilateral;   TONSILLECTOMY     Family History  Problem Relation Age of Onset   Other Mother        essesntial tremors   Diabetes Mother    Dementia Mother    Pancreatic cancer Father    Heart disease Father    Heart disease Paternal Aunt    Heart disease Paternal Grandmother    Esophageal cancer Neg Hx    Colon cancer Neg Hx    Rectal cancer Neg Hx    Stomach cancer Neg Hx    Social History   Occupational History   Not on file  Tobacco Use   Smoking status: Never   Smokeless tobacco: Never  Vaping Use   Vaping status: Never Used  Substance and Sexual Activity   Alcohol use: Not Currently    Comment: 1-3 yearly   Drug use: Never  Sexual activity: Not Currently    Birth control/protection: Post-menopausal, None    Comment: Previously Depo-Provera injections   Tobacco Counseling Counseling given: Not Answered  SDOH Screenings   Food Insecurity: No Food Insecurity (11/27/2024)  Housing: Unknown (11/27/2024)  Transportation Needs: No Transportation Needs (11/27/2024)  Utilities: Not At Risk (11/27/2024)  Alcohol Screen: Low Risk (11/27/2024)  Depression (PHQ2-9): Low Risk (11/27/2024)  Recent Concern: Depression (PHQ2-9) - Medium Risk (09/12/2024)  Financial Resource Strain: Low Risk (11/27/2024)  Physical Activity: Inactive (11/27/2024)  Social Connections: Socially Isolated (11/27/2024)  Stress: No Stress Concern Present (11/27/2024)  Tobacco Use: Low Risk (11/27/2024)  Health Literacy: Adequate Health Literacy (11/27/2024)   See flowsheets for full  screening details  Depression Screen PHQ 2 & 9 Depression Scale- Over the past 2 weeks, how often have you been bothered by any of the following problems? Little interest or pleasure in doing things: 0 Feeling down, depressed, or hopeless (PHQ Adolescent also includes...irritable): 0 PHQ-2 Total Score: 0 Trouble falling or staying asleep, or sleeping too much: 0 Feeling tired or having little energy: 0 Poor appetite or overeating (PHQ Adolescent also includes...weight loss): 0 Feeling bad about yourself - or that you are a failure or have let yourself or your family down: 0 Trouble concentrating on things, such as reading the newspaper or watching television (PHQ Adolescent also includes...like school work): 0 Moving or speaking so slowly that other people could have noticed. Or the opposite - being so fidgety or restless that you have been moving around a lot more than usual: 0 Thoughts that you would be better off dead, or of hurting yourself in some way: 0 PHQ-9 Total Score: 0 If you checked off any problems, how difficult have these problems made it for you to do your work, take care of things at home, or get along with other people?: Not difficult at all  Depression Treatment Depression Interventions/Treatment : EYV7-0 Score <4 Follow-up Not Indicated     Goals Addressed             This Visit's Progress    Patient Stated       11/28/2023, denies goals             Objective:    Today's Vitals   There is no height or weight on file to calculate BMI.  Hearing/Vision screen Hearing Screening - Comments:: Denies hearing issues Vision Screening - Comments:: Regular eye exams, Dr. Scarlet Immunizations and Health Maintenance Health Maintenance  Topic Date Due   Hepatitis C Screening  Never done   Colonoscopy  09/20/2023   COVID-19 Vaccine (9 - 2025-26 season) 07/24/2024   Mammogram  09/18/2025   Medicare Annual Wellness (AWV)  11/27/2025   DTaP/Tdap/Td (2 - Td or  Tdap) 03/22/2031   Pneumococcal Vaccine: 50+ Years  Completed   Influenza Vaccine  Completed   Bone Density Scan  Completed   Zoster Vaccines- Shingrix   Completed   Meningococcal B Vaccine  Aged Out        Assessment/Plan:  This is a routine wellness examination for Adria.  Patient Care Team: Nche, Roselie Rockford, NP as PCP - General (Internal Medicine) Tobb, Kardie, DO as PCP - Cardiology (Cardiology) Ob/Gyn, [facility]  I have personally reviewed and noted the following in the patients chart:   Medical and social history Use of alcohol, tobacco or illicit drugs  Current medications and supplements including opioid prescriptions. Functional ability and status Nutritional status Physical activity Advanced directives List of other  physicians Hospitalizations, surgeries, and ER visits in previous 12 months Vitals Screenings to include cognitive, depression, and falls Referrals and appointments  No orders of the defined types were placed in this encounter.  In addition, I have reviewed and discussed with patient certain preventive protocols, quality metrics, and best practice recommendations. A written personalized care plan for preventive services as well as general preventive health recommendations were provided to patient.   Ardella FORBES Dawn, LPN   06/26/7972   Return in 1 year (on 11/27/2025).  After Visit Summary: (MyChart) Due to this being a telephonic visit, the after visit summary with patients personalized plan was offered to patient via MyChart   Nurse Notes: Vaccines not given: Will obtain covid at pharmacy HM Addressed: Working on scheduling colonoscopy. Due for Hep C screening.  "

## 2024-11-27 NOTE — Patient Instructions (Signed)
 Ms. Brines,  Thank you for taking the time for your Medicare Wellness Visit. I appreciate your continued commitment to your health goals. Please review the care plan we discussed, and feel free to reach out if I can assist you further.  Please note that Annual Wellness Visits do not include a physical exam. Some assessments may be limited, especially if the visit was conducted virtually. If needed, we may recommend an in-person follow-up with your provider.  Ongoing Care Seeing your primary care provider every 3 to 6 months helps us  monitor your health and provide consistent, personalized care.   Referrals If a referral was made during today's visit and you haven't received any updates within two weeks, please contact the referred provider directly to check on the status.  Recommended Screenings:  Health Maintenance  Topic Date Due   Hepatitis C Screening  Never done   Colon Cancer Screening  09/20/2023   COVID-19 Vaccine (9 - 2025-26 season) 07/24/2024   Medicare Annual Wellness Visit  11/14/2024   Breast Cancer Screening  09/18/2025   DTaP/Tdap/Td vaccine (2 - Td or Tdap) 03/22/2031   Pneumococcal Vaccine for age over 58  Completed   Flu Shot  Completed   Osteoporosis screening with Bone Density Scan  Completed   Zoster (Shingles) Vaccine  Completed   Meningitis B Vaccine  Aged Out       11/27/2024    9:03 AM  Advanced Directives  Does Patient Have a Medical Advance Directive? Yes  Type of Estate Agent of [place];Living will  Copy of Healthcare Power of Attorney in Chart? Yes - validated most recent copy scanned in chart (See row information)    Vision: Annual vision screenings are recommended for early detection of glaucoma, cataracts, and diabetic retinopathy. These exams can also reveal signs of chronic conditions such as diabetes and high blood pressure.  Dental: Annual dental screenings help detect early signs of oral cancer, gum disease, and  other conditions linked to overall health, including heart disease and diabetes.  Please see the attached documents for additional preventive care recommendations.

## 2024-12-20 ENCOUNTER — Encounter (HOSPITAL_COMMUNITY): Payer: Self-pay | Admitting: Nurse Practitioner

## 2025-02-07 ENCOUNTER — Ambulatory Visit: Admitting: Nurse Practitioner

## 2025-12-03 ENCOUNTER — Ambulatory Visit
